# Patient Record
Sex: Female | Born: 2000 | Race: Black or African American | Hispanic: No | Marital: Married | Smoking: Current every day smoker
Health system: Southern US, Community
[De-identification: ages and names within clinical notes are randomized; demographics above are authoritative.]

## PROBLEM LIST (undated history)

## (undated) DIAGNOSIS — E119 Type 2 diabetes mellitus without complications: Secondary | ICD-10-CM

## (undated) DIAGNOSIS — F419 Anxiety disorder, unspecified: Secondary | ICD-10-CM

## (undated) DIAGNOSIS — M549 Dorsalgia, unspecified: Secondary | ICD-10-CM

## (undated) DIAGNOSIS — H4710 Unspecified papilledema: Secondary | ICD-10-CM

## (undated) DIAGNOSIS — F32A Depression, unspecified: Secondary | ICD-10-CM

## (undated) DIAGNOSIS — E282 Polycystic ovarian syndrome: Secondary | ICD-10-CM

## (undated) HISTORY — DX: Polycystic ovarian syndrome: E28.2

## (undated) HISTORY — DX: Type 2 diabetes mellitus without complications: E11.9

## (undated) HISTORY — DX: Anxiety disorder, unspecified: F41.9

## (undated) HISTORY — DX: Depression, unspecified: F32.A

## (undated) HISTORY — PX: WISDOM TOOTH EXTRACTION: SHX21

## (undated) HISTORY — DX: Unspecified papilledema: H47.10

---
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
## 2019-11-08 ENCOUNTER — Encounter: Payer: Medicaid Other | Admitting: Adult Health

## 2020-04-03 ENCOUNTER — Other Ambulatory Visit: Payer: Self-pay

## 2020-04-03 ENCOUNTER — Ambulatory Visit
Admission: EM | Admit: 2020-04-03 | Discharge: 2020-04-03 | Discharge status: Absent without leave | Payer: Commercial Managed Care - PPO

## 2020-04-03 ENCOUNTER — Emergency Department (HOSPITAL_COMMUNITY)
Admission: EM | Admit: 2020-04-03 | Discharge: 2020-04-03 | Disposition: A | Discharge status: Home / Self Care | Payer: Commercial Managed Care - PPO

## 2020-04-22 ENCOUNTER — Ambulatory Visit (INDEPENDENT_AMBULATORY_CARE_PROVIDER_SITE_OTHER): Payer: Commercial Managed Care - PPO | Admitting: Nurse Practitioner

## 2020-04-22 ENCOUNTER — Encounter: Payer: Self-pay | Admitting: Nurse Practitioner

## 2020-04-22 ENCOUNTER — Other Ambulatory Visit: Payer: Self-pay

## 2020-04-22 DIAGNOSIS — Z139 Encounter for screening, unspecified: Secondary | ICD-10-CM | POA: Diagnosis not present

## 2020-04-22 DIAGNOSIS — Z7689 Persons encountering health services in other specified circumstances: Secondary | ICD-10-CM | POA: Diagnosis not present

## 2020-04-22 DIAGNOSIS — F419 Anxiety disorder, unspecified: Secondary | ICD-10-CM | POA: Insufficient documentation

## 2020-04-22 DIAGNOSIS — F32A Depression, unspecified: Secondary | ICD-10-CM

## 2020-04-22 DIAGNOSIS — IMO0002 Reserved for concepts with insufficient information to code with codable children: Secondary | ICD-10-CM | POA: Insufficient documentation

## 2020-04-22 DIAGNOSIS — E1165 Type 2 diabetes mellitus with hyperglycemia: Secondary | ICD-10-CM | POA: Diagnosis not present

## 2020-04-22 MED ORDER — BLOOD GLUCOSE METER KIT: PACK | 0 refills | Status: DC

## 2020-04-22 MED ORDER — SERTRALINE HCL 50 MG PO TABS: 50.0000 mg | ORAL_TABLET | Freq: Every day | ORAL | 3 refills | Status: DC

## 2020-04-22 MED ORDER — METFORMIN HCL 500 MG PO TABS: 500.0000 mg | ORAL_TABLET | Freq: Two times a day (BID) | ORAL | 3 refills | Status: DC

## 2020-04-22 NOTE — Assessment & Plan Note (Addendum)
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 Sertraline -she states she is not pregnant and does not plan on becoming pregnant -med check in 1 month -GAD-7 = 19 and PHQ-9 = 17 today

## 2020-04-22 NOTE — Assessment & Plan Note (Signed)
-  request records from her GYN

## 2020-04-22 NOTE — Assessment & Plan Note (Signed)
-  A1c was 8.4% last week with GYN -Rx. Meter, lancets, and strips -Rx. metformin

## 2020-04-22 NOTE — Assessment & Plan Note (Signed)
-  BMI 55.27 -We discussed weight loss and insulin resistance -dietary handout provided

## 2020-04-22 NOTE — Assessment & Plan Note (Signed)
-  she states she had HIV testing previously -will screen for HCV with next set of labs

## 2020-04-22 NOTE — Patient Instructions (Signed)
Hyperglycemia Hyperglycemia occurs when the level of sugar (glucose) in the blood is too high. Glucose is a type of sugar that provides the body's main source of energy. Certain hormones (insulin and glucagon) control the level of glucose in the blood. Insulin lowers blood glucose, and glucagon increases blood glucose. Hyperglycemia can result from not having enough insulin in the bloodstream, or from the body not responding normally to insulin. Hyperglycemia occurs most often in people who have diabetes (diabetes mellitus), but it can happen in people who do not have diabetes. It can develop quickly, and it can be life-threatening if it causes you to become severely dehydrated (diabetic ketoacidosis or hyperglycemic hyperosmolar state). Severe hyperglycemia is a medical emergency. For most people with diabetes, a blood glucose level above 240 mg/dL is considered hyperglycemia. What are the causes? If you have diabetes, hyperglycemia may be caused by:  Medicines that increase blood glucose or affect your diabetes control.  Getting less physical activity.  Eating more than planned.  Being sick or injured, having an infection, or having surgery.  Stress.  Not giving yourself enough insulin (if you are taking insulin). If you have undiagnosed diabetes, this may be the reason you have hyperglycemia. If you do not have diabetes, hyperglycemia may be caused by:  Certain medicines, including: ? Steroid medicines. ? Beta-blockers. ? Epinephrine. ? Thiazide diuretics.  Stress.  Having a serious illness, an infection, or surgery.  Diseases of the pancreas. What increases the risk? Hyperglycemia is more likely to develop in people who have risk factors for diabetes, such as:  Having a family member with diabetes.  Certain conditions in which the body's disease-fighting system (immune system) attacks itself (autoimmune disorders).  Being overweight or obese.  Having an inactive  (sedentary) lifestyle.  Having been diagnosed with insulin resistance.  Having a history of prediabetes, gestational diabetes, or polycystic ovarian syndrome (PCOS). What are the signs or symptoms? Hyperglycemia may not cause any symptoms. If you do have symptoms, they may include:  Increased thirst.  Needing to urinate more often than usual.  Hunger.  Feeling very tired.  Blurry vision. Other symptoms may develop if hyperglycemia gets worse, such as:  Dry mouth.  Abdominal pain.  Loss of appetite.  Fruity-smelling breath.  Weakness.  Unexpected weight loss.  Tingling or numbness in the hands or feet.  Headache.  Cuts or bruises that are slow to heal. How is this diagnosed? Hyperglycemia is diagnosed with a blood test to measure your blood glucose level. This blood test is usually done while you are having symptoms. Your health care provider may also do a physical exam and review your medical history. You may have more tests to determine the cause of your hyperglycemia, such as:  A fasting blood glucose (FBG) test. You will not be allowed to eat (you will fast) for at least 8 hours before a blood sample is taken.  An A1C blood test. This provides information about blood glucose control over the previous 2-3 months.  An oral glucose tolerance test (OGTT). This measures your blood glucose at two times: ? After fasting. This is your baseline blood glucose level. ? 2 hours after drinking a beverage that contains glucose. How is this treated? Treatment depends on the cause of your hyperglycemia. Treatment may include:  Taking medicine to regulate your blood glucose levels. If you take insulin or other diabetes medicines, your medicine or dosage may be adjusted.  Lifestyle changes, such as exercising more, eating healthier foods, or losing  weight.  Treating an illness or infection.  Checking your blood glucose more often.  Stopping or reducing steroid  medicines. If your hyperglycemia becomes severe and it results in diabetic ketoacidosis or hyperglycemic hyperosmolar state, you must be hospitalized and given IV fluids and IV insulin. Follow these instructions at home: General instructions  Take over-the-counter and prescription medicines only as told by your health care provider.  Do not use any products that contain nicotine or tobacco. These products include cigarettes, chewing tobacco, and vaping devices, such as e-cigarettes. If you need help quitting, ask your health care provider.  If you drink alcohol: ? Limit how much you have to:  0-1 drink a day for women who are not pregnant.  0-2 drinks a day for men. ? Know how much alcohol is in a drink. In the [place] one drink equals one 12 oz bottle of beer (355 mL), one 5 oz glass of wine (148 mL), or one 1 oz glass of hard liquor (44 mL).  Learn to manage stress. If you need help with this, ask your health care provider.  Do exercises as told by your health care provider.  Keep all follow-up visits. This is important. Eating and drinking  Maintain a healthy weight.  Stay hydrated, especially when you exercise, get sick, or spend time in hot temperatures.  Drink enough fluid to keep your urine pale yellow.   If you have diabetes:  Know the symptoms of hyperglycemia.  Follow your diabetes management plan as told by your health care provider. Make sure you: ? Take your insulin and medicines as told. ? Follow your exercise plan. ? Follow your meal plan. Eat on time, and do not skip meals. ? Check your blood glucose as often as told. Make sure to check your blood glucose before and after exercise. If you exercise longer or in a different way, check your blood glucose more often. ? Follow your sick day plan whenever you cannot eat or drink normally. Make this plan in advance with your health care provider.  Share your diabetes management plan with people in your workplace,  school, and household.  Check your urine for ketones when you are ill and as told by your health care provider.  Carry a medical alert card or wear medical alert jewelry.   Where to find more information American Diabetes Association: www.diabetes.org Contact a health care provider if:  Your blood glucose is at or above 240 mg/dL (13.3 mmol/L) for 2 days in a row.  You have problems keeping your blood glucose in your target range.  You have frequent episodes of hyperglycemia.  You have signs of illness, such as nausea, vomiting, or fever. Get help right away if:  Your blood glucose monitor reads "high" even when you are taking insulin.  You have trouble breathing.  You have a change in how you think, feel, or act (mental status).  You have nausea or vomiting that does not go away. These symptoms may represent a serious problem that is an emergency. Do not wait to see if the symptoms will go away. Get medical help right away. Call your local emergency services (911 in the [place] Do not drive yourself to the hospital. Summary  Hyperglycemia occurs when the level of sugar (glucose) in the blood is too high.  Hyperglycemia can happen with or without diabetes, and severe hyperglycemia can be life-threatening.  Hyperglycemia is diagnosed with a blood test to measure your blood glucose level. This blood test  is usually done while you are having symptoms. Your health care provider may also do a physical exam and review your medical history.  If you have diabetes, follow your diabetes management plan as told by your health care provider.  Contact your health care provider if you have problems keeping your blood glucose in your target range. This information is not intended to replace advice given to you by your health care provider. Make sure you discuss any questions you have with your health care provider. Document Revised: 12/29/2019 Document Reviewed: 12/29/2019 Elsevier Patient  Education  2021 Elsevier Inc.  Diabetes Mellitus and Nutrition, Adult When you have diabetes, or diabetes mellitus, it is very important to have healthy eating habits because your blood sugar (glucose) levels are greatly affected by what you eat and drink. Eating healthy foods in the right amounts, at about the same times every day, can help you:  Control your blood glucose.  Lower your risk of heart disease.  Improve your blood pressure.  Reach or maintain a healthy weight. What can affect my meal plan? Every person with diabetes is different, and each person has different needs for a meal plan. Your health care provider may recommend that you work with a dietitian to make a meal plan that is best for you. Your meal plan may vary depending on factors such as:  The calories you need.  The medicines you take.  Your weight.  Your blood glucose, blood pressure, and cholesterol levels.  Your activity level.  Other health conditions you have, such as heart or kidney disease. How do carbohydrates affect me? Carbohydrates, also called carbs, affect your blood glucose level more than any other type of food. Eating carbs naturally raises the amount of glucose in your blood. Carb counting is a method for keeping track of how many carbs you eat. Counting carbs is important to keep your blood glucose at a healthy level, especially if you use insulin or take certain oral diabetes medicines. It is important to know how many carbs you can safely have in each meal. This is different for every person. Your dietitian can help you calculate how many carbs you should have at each meal and for each snack. How does alcohol affect me? Alcohol can cause a sudden decrease in blood glucose (hypoglycemia), especially if you use insulin or take certain oral diabetes medicines. Hypoglycemia can be a life-threatening condition. Symptoms of hypoglycemia, such as sleepiness, dizziness, and confusion, are similar to  symptoms of having too much alcohol.  Do not drink alcohol if: ? Your health care provider tells you not to drink. ? You are pregnant, may be pregnant, or are planning to become pregnant.  If you drink alcohol: ? Do not drink on an empty stomach. ? Limit how much you use to:  0-1 drink a day for women.  0-2 drinks a day for men. ? Be aware of how much alcohol is in your drink. In the [place] one drink equals one 12 oz bottle of beer (355 mL), one 5 oz glass of wine (148 mL), or one 1 oz glass of hard liquor (44 mL). ? Keep yourself hydrated with water, diet soda, or unsweetened iced tea.  Keep in mind that regular soda, juice, and other mixers may contain a lot of sugar and must be counted as carbs. What are tips for following this plan? Reading food labels  Start by checking the serving size on the "Nutrition Facts" label of packaged foods and drinks.  The amount of calories, carbs, fats, and other nutrients listed on the label is based on one serving of the item. Many items contain more than one serving per package.  Check the total grams (g) of carbs in one serving. You can calculate the number of servings of carbs in one serving by dividing the total carbs by 15. For example, if a food has 30 g of total carbs per serving, it would be equal to 2 servings of carbs.  Check the number of grams (g) of saturated fats and trans fats in one serving. Choose foods that have a low amount or none of these fats.  Check the number of milligrams (mg) of salt (sodium) in one serving. Most people should limit total sodium intake to less than 2,300 mg per day.  Always check the nutrition information of foods labeled as "low-fat" or "nonfat." These foods may be higher in added sugar or refined carbs and should be avoided.  Talk to your dietitian to identify your daily goals for nutrients listed on the label. Shopping  Avoid buying canned, pre-made, or processed foods. These foods tend to be high in  fat, sodium, and added sugar.  Shop around the outside edge of the grocery store. This is where you will most often find fresh fruits and vegetables, bulk grains, fresh meats, and fresh dairy. Cooking  Use low-heat cooking methods, such as baking, instead of high-heat cooking methods like deep frying.  Cook using healthy oils, such as olive, canola, or sunflower oil.  Avoid cooking with butter, cream, or high-fat meats. Meal planning  Eat meals and snacks regularly, preferably at the same times every day. Avoid going long periods of time without eating.  Eat foods that are high in fiber, such as fresh fruits, vegetables, beans, and whole grains. Talk with your dietitian about how many servings of carbs you can eat at each meal.  Eat 4-6 oz (112-168 g) of lean protein each day, such as lean meat, chicken, fish, eggs, or tofu. One ounce (oz) of lean protein is equal to: ? 1 oz (28 g) of meat, chicken, or fish. ? 1 egg. ?  cup (62 g) of tofu.  Eat some foods each day that contain healthy fats, such as avocado, nuts, seeds, and fish.   What foods should I eat? Fruits Berries. Apples. Oranges. Peaches. Apricots. Plums. Grapes. Mango. Papaya. Pomegranate. Kiwi. Cherries. Vegetables Lettuce. Spinach. Leafy greens, including kale, chard, collard greens, and mustard greens. Beets. Cauliflower. Cabbage. Broccoli. Carrots. Green beans. Tomatoes. Peppers. Onions. Cucumbers. Brussels sprouts. Grains Whole grains, such as whole-wheat or whole-grain bread, crackers, tortillas, cereal, and pasta. Unsweetened oatmeal. Quinoa. Brown or wild rice. Meats and other proteins Seafood. Poultry without skin. Lean cuts of poultry and beef. Tofu. Nuts. Seeds. Dairy Low-fat or fat-free dairy products such as milk, yogurt, and cheese. The items listed above may not be a complete list of foods and beverages you can eat. Contact a dietitian for more information. What foods should I avoid? Fruits Fruits canned  with syrup. Vegetables Canned vegetables. Frozen vegetables with butter or cream sauce. Grains Refined white flour and flour products such as bread, pasta, snack foods, and cereals. Avoid all processed foods. Meats and other proteins Fatty cuts of meat. Poultry with skin. Breaded or fried meats. Processed meat. Avoid saturated fats. Dairy Full-fat yogurt, cheese, or milk. Beverages Sweetened drinks, such as soda or iced tea. The items listed above may not be a complete list of foods and beverages  you should avoid. Contact a dietitian for more information. Questions to ask a health care provider  Do I need to meet with a diabetes educator?  Do I need to meet with a dietitian?  What number can I call if I have questions?  When are the best times to check my blood glucose? Where to find more information:  American Diabetes Association: diabetes.org  Academy of Nutrition and Dietetics: www.eatright.[employer] of Diabetes and Digestive and Kidney Diseases: CarFlippers.tn  Association of Diabetes Care and Education Specialists: www.diabeteseducator.org Summary  It is important to have healthy eating habits because your blood sugar (glucose) levels are greatly affected by what you eat and drink.  A healthy meal plan will help you control your blood glucose and maintain a healthy lifestyle.  Your health care provider may recommend that you work with a dietitian to make a meal plan that is best for you.  Keep in mind that carbohydrates (carbs) and alcohol have immediate effects on your blood glucose levels. It is important to count carbs and to use alcohol carefully. This information is not intended to replace advice given to you by your health care provider. Make sure you discuss any questions you have with your health care provider. Document Revised: 02/21/2019 Document Reviewed: 02/21/2019 Elsevier Patient Education  2021 [employer].

## 2020-04-22 NOTE — Progress Notes (Signed)
New Patient Office Visit  Subjective:  Patient ID: Savannah Parker, female    DOB: June 09, 2000  Age: 20 y.o. MRN: [id number]  CC:  Chief Complaint  Patient presents with  . New Patient (Initial Visit)    HPI Savannah Parker presents for new patient visit. No recent PCP.  Last saw GYN last week. Dr. Gerrit Friends is her GYN. Last labs were drawn last week. Her A1c was 8.4 last week. Last physical was over a year ago.  Her mother died, and she is taking care of her 3 siblings.  She is also married and she is having difficulty with all of the caregiver strain from her siblings. She states she is anxious all the time and has some depression as well.  She has been taking melatonin and "sleep-aid" to fall asleep. Gad-7 =19, PHQ-9 = 17 today.  She has an appointment with a therapist in a week, with [facility] in [place].She has been taking care of her siblings since she was age 19, but her mother passed last year and she is still feeling emotional about this.  Past Medical History:  Diagnosis Date  . Anxiety    Phreesia 04/19/2020  . Back pain   . Depression   . Diabetes mellitus without complication [facility])     Past Surgical History:  Procedure Laterality Date  . WISDOM TOOTH EXTRACTION Bilateral    2 removed    History reviewed. No pertinent family history.  Social History   Socioeconomic History  . Marital status: Married    Spouse name: Not on file  . Number of children: Not on file  . Years of education: Not on file  . Highest education level: Not on file  Occupational History    Comment: scheduling with [employer]  Tobacco Use  . Smoking status: Current Every Day Smoker    Packs/day: 0.25    Types: Cigarettes  . Smokeless tobacco: Never Used  Vaping Use  . Vaping Use: Never used  Substance and Sexual Activity  . Alcohol use: No    Comment: rarely  . Drug use: No  . Sexual activity: Yes    Birth control/protection: None  Other Topics Concern  . Not on file  Social History Narrative   . Not on file   Social Determinants of Health   Financial Resource Strain: Not on file  Food Insecurity: Not on file  Transportation Needs: Not on file  Physical Activity: Not on file  Stress: Not on file  Social Connections: Not on file  Intimate Partner Violence: Not on file    ROS Review of Systems  Constitutional: Negative.   Respiratory: Negative.   Cardiovascular: Negative.   Genitourinary: Positive for pelvic pain.       Followed by GYN    Objective:   Today's Vitals: BP (!) 142/80   Pulse (!) 116   Temp 98.6 F (37 C)   Resp 20   Ht 5' 3"  (1.6 m)   Wt (!) 312 lb (141.5 kg)   SpO2 99%   BMI 55.27 kg/m   Physical Exam Constitutional:      Appearance: She is obese.  Cardiovascular:     Rate and Rhythm: Normal rate and regular rhythm.     Pulses: Normal pulses.     Heart sounds: Normal heart sounds.  Pulmonary:     Effort: Pulmonary effort is normal.     Breath sounds: Normal breath sounds.  Neurological:     Mental Status: She is alert.  Assessment & Plan:   Problem List Items Addressed This Visit      Endocrine   Type II diabetes mellitus, uncontrolled ([place])    -A1c was 8.4% last week with GYN -Rx. Meter, lancets, and strips -Rx. metformin      Relevant Medications   metFORMIN (GLUCOPHAGE) 500 MG tablet   Other Relevant Orders   CBC with Differential/Platelet   CMP14+EGFR   Hemoglobin A1c   Lipid Panel With LDL/HDL Ratio   Urine Microalbumin w/creat. ratio     Other   Encounter to establish care    -request records from her GYN       Screening due    -she states she had HIV testing previously -will screen for HCV with next set of labs      Relevant Orders   HCV Ab w/Rflx to Verification   Morbid obesity ([place])    -BMI 55.27 -We discussed weight loss and insulin resistance -dietary handout provided      Relevant Medications   metFORMIN (GLUCOPHAGE) 500 MG tablet   Anxiety and depression    -her mother recently died  and she is processing that; still gets emotional -she is caregiver to 3 siblings and has a husband -Rx. Sertraline -she states she is not pregnant and does not plan on becoming pregnant -med check in 1 month -GAD-7 = 19 and PHQ-9 = 17 today      Relevant Medications   hydrOXYzine (ATARAX/VISTARIL) 25 MG tablet   sertraline (ZOLOFT) 50 MG tablet      Outpatient Encounter Medications as of 04/22/2020  Medication Sig  . blood glucose meter kit and supplies Dispense based on patient and insurance preference. Use daily to monitor blood sugar. (E11.65)  . hydrOXYzine (ATARAX/VISTARIL) 25 MG tablet Take 25 mg by mouth 3 (three) times daily as needed.  . metFORMIN (GLUCOPHAGE) 500 MG tablet Take 1 tablet (500 mg total) by mouth 2 (two) times daily with a meal.  . sertraline (ZOLOFT) 50 MG tablet Take 1 tablet (50 mg total) by mouth daily.  . traMADol (ULTRAM) 50 MG tablet Take 1 every 6-8 hours for pain not helped by Tylenol or Motrin   No facility-administered encounter medications on file as of 04/22/2020.    Follow-up: Return in about 1 month (around 05/23/2020) for Medication check.   Noreene Larsson, NP

## 2020-05-14 ENCOUNTER — Other Ambulatory Visit: Payer: Self-pay | Admitting: Nurse Practitioner

## 2020-05-17 ENCOUNTER — Other Ambulatory Visit: Payer: Self-pay

## 2020-05-17 ENCOUNTER — Telehealth: Payer: Self-pay

## 2020-05-17 DIAGNOSIS — E1165 Type 2 diabetes mellitus with hyperglycemia: Secondary | ICD-10-CM

## 2020-05-17 MED ORDER — BLOOD GLUCOSE METER KIT: PACK | 0 refills | Status: DC

## 2020-05-17 NOTE — Telephone Encounter (Signed)
Patient needing refills [facility] [place] for her test strips p# [phone]

## 2020-05-17 NOTE — Telephone Encounter (Signed)
Orders faxed to [facility].

## 2020-05-20 ENCOUNTER — Other Ambulatory Visit: Payer: Self-pay

## 2020-05-20 ENCOUNTER — Other Ambulatory Visit: Payer: Self-pay | Admitting: Nurse Practitioner

## 2020-05-20 DIAGNOSIS — E1165 Type 2 diabetes mellitus with hyperglycemia: Secondary | ICD-10-CM

## 2020-05-20 MED ORDER — GLUCOSE BLOOD VI STRP: ORAL_STRIP | 12 refills | Status: DC

## 2020-05-20 MED ORDER — ACCU-CHEK SOFTCLIX LANCETS MISC: 12 refills | Status: AC

## 2020-05-20 MED ORDER — UNABLE TO FIND: 0 refills | Status: DC

## 2020-05-21 ENCOUNTER — Other Ambulatory Visit: Payer: Self-pay | Admitting: Nurse Practitioner

## 2020-05-21 DIAGNOSIS — E1165 Type 2 diabetes mellitus with hyperglycemia: Secondary | ICD-10-CM

## 2020-05-21 MED ORDER — BLOOD GLUCOSE METER KIT: PACK | 0 refills | Status: AC

## 2020-05-21 NOTE — Telephone Encounter (Signed)
Sent order for new meter since they don't have accucheck strips

## 2020-05-23 ENCOUNTER — Ambulatory Visit: Payer: Commercial Managed Care - PPO | Admitting: Nurse Practitioner

## 2020-06-19 ENCOUNTER — Encounter (HOSPITAL_COMMUNITY): Payer: Self-pay

## 2020-06-19 ENCOUNTER — Emergency Department (HOSPITAL_COMMUNITY): Payer: Medicaid Other

## 2020-06-19 ENCOUNTER — Emergency Department (HOSPITAL_COMMUNITY)
Admission: EM | Admit: 2020-06-19 | Discharge: 2020-06-19 | Disposition: A | Discharge status: Home / Self Care | Payer: Medicaid Other | Attending: Emergency Medicine | Admitting: Emergency Medicine

## 2020-06-19 ENCOUNTER — Other Ambulatory Visit: Payer: Self-pay

## 2020-06-19 DIAGNOSIS — F17210 Nicotine dependence, cigarettes, uncomplicated: Secondary | ICD-10-CM | POA: Diagnosis not present

## 2020-06-19 DIAGNOSIS — R000 Tachycardia, unspecified: Secondary | ICD-10-CM | POA: Diagnosis not present

## 2020-06-19 DIAGNOSIS — E119 Type 2 diabetes mellitus without complications: Secondary | ICD-10-CM | POA: Diagnosis not present

## 2020-06-19 DIAGNOSIS — R112 Nausea with vomiting, unspecified: Secondary | ICD-10-CM

## 2020-06-19 DIAGNOSIS — R509 Fever, unspecified: Secondary | ICD-10-CM | POA: Diagnosis present

## 2020-06-19 DIAGNOSIS — R0602 Shortness of breath: Secondary | ICD-10-CM

## 2020-06-19 DIAGNOSIS — Z7984 Long term (current) use of oral hypoglycemic drugs: Secondary | ICD-10-CM | POA: Diagnosis not present

## 2020-06-19 DIAGNOSIS — U071 COVID-19: Secondary | ICD-10-CM

## 2020-06-19 DIAGNOSIS — R197 Diarrhea, unspecified: Secondary | ICD-10-CM

## 2020-06-19 LAB — CBC WITH DIFFERENTIAL/PLATELET
Abs Immature Granulocytes: 0.04 10*3/uL (ref 0.00–0.07)
Basophils Absolute: 0 10*3/uL (ref 0.0–0.1)
Basophils Relative: 0 %
Eosinophils Absolute: 0.1 10*3/uL (ref 0.0–0.5)
Eosinophils Relative: 1 %
HCT: 37.3 % (ref 36.0–46.0)
Hemoglobin: 12.4 g/dL (ref 12.0–15.0)
Immature Granulocytes: 0 %
Lymphocytes Relative: 14 %
Lymphs Abs: 1.6 10*3/uL (ref 0.7–4.0)
MCH: 29.5 pg (ref 26.0–34.0)
MCHC: 33.2 g/dL (ref 30.0–36.0)
MCV: 88.6 fL (ref 80.0–100.0)
Monocytes Absolute: 1.1 10*3/uL — ABNORMAL HIGH (ref 0.1–1.0)
Monocytes Relative: 10 %
Neutro Abs: 8.2 10*3/uL — ABNORMAL HIGH (ref 1.7–7.7)
Neutrophils Relative %: 75 %
Platelets: 404 10*3/uL — ABNORMAL HIGH (ref 150–400)
RBC: 4.21 MIL/uL (ref 3.87–5.11)
RDW: 13.2 % (ref 11.5–15.5)
WBC: 11 10*3/uL — ABNORMAL HIGH (ref 4.0–10.5)
nRBC: 0 % (ref 0.0–0.2)

## 2020-06-19 LAB — COMPREHENSIVE METABOLIC PANEL
ALT: 22 U/L (ref 0–44)
AST: 21 U/L (ref 15–41)
Albumin: 3.6 g/dL (ref 3.5–5.0)
Alkaline Phosphatase: 56 U/L (ref 38–126)
Anion gap: 12 (ref 5–15)
BUN: 6 mg/dL (ref 6–20)
CO2: 22 mmol/L (ref 22–32)
Calcium: 8.5 mg/dL — ABNORMAL LOW (ref 8.9–10.3)
Chloride: 100 mmol/L (ref 98–111)
Creatinine, Ser: 0.68 mg/dL (ref 0.44–1.00)
GFR, Estimated: >60 mL/min (ref >60)
Glucose, Bld: 93 mg/dL (ref 70–99)
Potassium: 3.4 mmol/L — ABNORMAL LOW (ref 3.5–5.1)
Sodium: 134 mmol/L — ABNORMAL LOW (ref 135–145)
Total Bilirubin: 0.7 mg/dL (ref 0.3–1.2)
Total Protein: 8.5 g/dL — ABNORMAL HIGH (ref 6.5–8.1)

## 2020-06-19 LAB — URINALYSIS, ROUTINE W REFLEX MICROSCOPIC
Bilirubin Urine: NEGATIVE
Glucose, UA: NEGATIVE mg/dL
Hgb urine dipstick: NEGATIVE
Ketones, ur: 20 mg/dL — AB
Nitrite: NEGATIVE
Protein, ur: NEGATIVE mg/dL
Specific Gravity, Urine: 1.019 (ref 1.005–1.030)
pH: 7 (ref 5.0–8.0)

## 2020-06-19 LAB — LIPASE, BLOOD: Lipase: 19 U/L (ref 11–51)

## 2020-06-19 LAB — RESP PANEL BY RT-PCR (FLU A&B, COVID) ARPGX2
Influenza A by PCR: NEGATIVE
Influenza B by PCR: NEGATIVE
SARS Coronavirus 2 by RT PCR: POSITIVE — AB

## 2020-06-19 LAB — POC SARS CORONAVIRUS 2 AG -  ED: SARS Coronavirus 2 Ag: NEGATIVE

## 2020-06-19 LAB — I-STAT BETA HCG BLOOD, ED (MC, WL, AP ONLY): I-stat hCG, quantitative: <5 m[IU]/mL (ref <5)

## 2020-06-19 LAB — TROPONIN I (HIGH SENSITIVITY)
Troponin I (High Sensitivity): 3 ng/L (ref <18)
Troponin I (High Sensitivity): <2 ng/L (ref <18)

## 2020-06-19 MED ORDER — ACETAMINOPHEN 325 MG PO TABS
650.0000 mg | ORAL_TABLET | Freq: Once | ORAL | Status: AC
  Administered 2020-06-19: 650 mg via ORAL
  Filled 2020-06-19: qty 2

## 2020-06-19 MED ORDER — IOHEXOL 350 MG/ML SOLN
100.0000 mL | Freq: Once | INTRAVENOUS | Status: AC | PRN
  Administered 2020-06-19: 100 mL via INTRAVENOUS

## 2020-06-19 MED ORDER — ALBUTEROL SULFATE HFA 108 (90 BASE) MCG/ACT IN AERS
2.0000 | INHALATION_SPRAY | RESPIRATORY_TRACT | Status: DC | PRN
  Administered 2020-06-19: 2 via RESPIRATORY_TRACT
  Filled 2020-06-19: qty 6.7

## 2020-06-19 MED ORDER — ONDANSETRON 4 MG PO TBDP: 4.0000 mg | ORAL_TABLET | Freq: Three times a day (TID) | ORAL | 0 refills | Status: DC | PRN

## 2020-06-19 MED ORDER — MORPHINE SULFATE (PF) 4 MG/ML IV SOLN
4.0000 mg | Freq: Once | INTRAVENOUS | Status: AC
Start: 2020-06-19 — End: 2020-06-19
  Administered 2020-06-19: 4 mg via INTRAVENOUS
  Filled 2020-06-19: qty 1

## 2020-06-19 MED ORDER — SODIUM CHLORIDE 0.9 % IV BOLUS
1000.0000 mL | Freq: Once | INTRAVENOUS | Status: AC
  Administered 2020-06-19: 1000 mL via INTRAVENOUS

## 2020-06-19 MED ORDER — ONDANSETRON HCL 4 MG/2ML IJ SOLN
4.0000 mg | Freq: Once | INTRAMUSCULAR | Status: AC
  Administered 2020-06-19: 4 mg via INTRAVENOUS
  Filled 2020-06-19: qty 2

## 2020-06-19 MED ORDER — DICYCLOMINE HCL 20 MG PO TABS: 20.0000 mg | ORAL_TABLET | Freq: Two times a day (BID) | ORAL | 0 refills | Status: DC

## 2020-06-19 NOTE — ED Notes (Addendum)
Pt able to tolerate PO fluids at this time.  

## 2020-06-19 NOTE — Discharge Instructions (Signed)
Your Covid test was positive here today.  This is likely the cause of your symptoms.  I recommend Tylenol and ibuprofen as needed for pain and fever.  I have written you a medication called Bentyl to help with abdominal pain.  I have also written you a medication called Zofran.  Take as needed for nausea and vomiting.  Follow up with primary care provider in the next few days for reevaluation  You will need to be out of work or school for 1 week.  I have written you a work note.  Return to emergency department if you have any new or worsening complaints

## 2020-06-19 NOTE — ED Triage Notes (Signed)
Pt c/o chest pressure, sob, n/v/d, body aches and fevers and rt flank pain x 5 days. Denies hx of kidney stones and denies exposure to covid. Has not taken antipyretic today, temp 101.

## 2020-06-19 NOTE — ED Provider Notes (Signed)
[facility] DEPT Provider Note   CSN: [id number] Arrival date & time: 06/19/20  1638     History Chief Complaint  Patient presents with  . Shortness of Breath    Savannah Parker is a 20 y.o. female with history significant for diabetes who presents for evaluation of feeling unwell.  States symptoms started on Sunday, 4 days PTA.  She is unvaccinated for Covid.  Has been running a fever.  Has been taking TheraFlu.  Has not taken anything for fever today.  She feels like she has had persistent shortness of breath and chest pain.  Pain located to right chest, left flank and back.  Also has diffuse right-sided abdominal pain.  Has been constant since Sunday.  Pain is not worse with food intake or she has had multiple episodes of NBNB emesis.  She is also having diffuse diarrhea that melena or bright red blood per rectum.  Her significant other is not sick.  She has had no sick contacts.  She denies any pelvic pain, vaginal discharge or concerns for STDs.  She denies any cough, hemoptysis.  No dysuria, hematuria.  No unilateral leg swelling, redness or warmth.  No prior history of PE, DVT, recent surgery, immobilization or malignancy.  Denies additional aggravating or alleviating factors.  No recent travel, antibiotics.  History obtained from patient and past medical records.  No interpreter used.  HPI     Past Medical History:  Diagnosis Date  . Anxiety    Phreesia 04/19/2020  . Back pain   . Depression   . Diabetes mellitus without complication [facility])     Patient Active Problem List   Diagnosis Date Noted  . Encounter to establish care 04/22/2020  . Screening due 04/22/2020  . Type II diabetes mellitus, uncontrolled ([place]) 04/22/2020  . Morbid obesity ([place]) 04/22/2020  . Anxiety and depression 04/22/2020    Past Surgical History:  Procedure Laterality Date  . WISDOM TOOTH EXTRACTION Bilateral    2 removed     OB History   No obstetric history on  file.     No family history on file.  Social History   Tobacco Use  . Smoking status: Current Every Day Smoker    Packs/day: 0.25    Types: Cigarettes  . Smokeless tobacco: Never Used  Vaping Use  . Vaping Use: Never used  Substance Use Topics  . Alcohol use: No    Comment: rarely  . Drug use: No    Home Medications Prior to Admission medications   Medication Sig Start Date End Date Taking? Authorizing Provider  acetaminophen (TYLENOL) 500 MG tablet Take 1,000 mg by mouth every 6 (six) hours as needed for moderate pain.   Yes [provider]  dicyclomine (BENTYL) 20 MG tablet Take 1 tablet (20 mg total) by mouth 2 (two) times daily. 06/19/20  Yes Morgann Woodburn A, PA-C  metFORMIN (GLUCOPHAGE) 500 MG tablet Take 1 tablet (500 mg total) by mouth 2 (two) times daily with a meal. 04/22/20  Yes Noreene Larsson, NP  ondansetron (ZOFRAN ODT) 4 MG disintegrating tablet Take 1 tablet (4 mg total) by mouth every 8 (eight) hours as needed for nausea or vomiting. 06/19/20  Yes Wesly Whisenant A, PA-C  sertraline (ZOLOFT) 50 MG tablet TAKE 1 TABLET BY MOUTH EVERY DAY Patient taking differently: Take 50 mg by mouth daily. 05/14/20  Yes Noreene Larsson, NP  ACCU-CHEK GUIDE test strip ACCU-CHECK GUIDE TEST STRIPS. USE DAILY AS INSTRUCTED 05/21/20  Noreene Larsson, NP  Accu-Chek Softclix Lancets lancets Use as instructed 05/20/20   Noreene Larsson, NP  blood glucose meter kit and supplies Dispense based on patient and insurance preference. Use daily to monitor blood sugar. (E11.65) 05/21/20   Noreene Larsson, NP  traMADol Veatrice Bourbon) 50 MG tablet Take 1 every 6-8 hours for pain not helped by Tylenol or Motrin Patient not taking: No sig reported 07/10/17   Milton Ferguson, MD  UNABLE TO FIND Accu-check guide monitor system Patient not taking: No sig reported 05/20/20   Noreene Larsson, NP    Allergies    Onion and Other  Review of Systems   Review of Systems  Constitutional: Positive for activity  change, appetite change, fatigue and fever.  HENT: Negative.   Respiratory: Positive for chest tightness and shortness of breath. Negative for cough, choking, wheezing and stridor.   Cardiovascular: Positive for chest pain (Tightness). Negative for palpitations and leg swelling.  Gastrointestinal: Positive for abdominal pain (Diffuse right abd pain), diarrhea, nausea and vomiting. Negative for anal bleeding, blood in stool and constipation.  Genitourinary: Positive for flank pain (Right flank). Negative for difficulty urinating and dysuria.  Musculoskeletal: Positive for myalgias. Negative for arthralgias, back pain, gait problem, joint swelling, neck pain and neck stiffness.  Skin: Negative.   Neurological: Negative.   All other systems reviewed and are negative.   Physical Exam Updated Vital Signs BP (!) 155/92   Pulse 91   Temp 99.7 F (37.6 C) (Oral)   Resp 19   Ht 5' 3"  (1.6 m)   Wt (!) 141.5 kg   LMP 06/10/2020   SpO2 99%   BMI 55.27 kg/m   Physical Exam Vitals and nursing note reviewed.  Constitutional:      General: She is not in acute distress.    Appearance: She is well-developed. She is not ill-appearing, toxic-appearing or diaphoretic.  HENT:     Head: Normocephalic and atraumatic.     Mouth/Throat:     Pharynx: Oropharynx is clear.  Eyes:     Pupils: Pupils are equal, round, and reactive to light.  Cardiovascular:     Rate and Rhythm: Tachycardia present.     Pulses: Normal pulses.     Heart sounds: Normal heart sounds.  Pulmonary:     Effort: Pulmonary effort is normal. No respiratory distress.     Breath sounds: Normal breath sounds.     Comments: Clear to auscultation bilaterally.  Speaks in full sentences. Chest:     Comments: Equal rise and fall to chest wall Abdominal:     General: Bowel sounds are normal. There is no distension.     Palpations: Abdomen is soft. There is no mass.     Tenderness: There is abdominal tenderness. There is no guarding  or rebound.     Comments: Soft, without rebound or guarding.  Diffuse tenderness to right abdomen.  Diffuse tenderness to right flank.  Negative left CVA tap.  Musculoskeletal:        General: Normal range of motion.     Cervical back: Normal range of motion and neck supple.     Right lower leg: No tenderness. No edema.     Left lower leg: No tenderness. No edema.     Comments: No bony tenderness.  Moves all 4 extremities without difficulty.  Compartments soft.  Bevelyn Buckles' sign negative.  Skin:    General: Skin is warm and dry.     Capillary Refill: Capillary refill  takes less than 2 seconds.     Comments: No edema, erythema or warmth.  Neurological:     General: No focal deficit present.     Mental Status: She is alert and oriented to person, place, and time.     Comments: Cranial nerves II to XII grossly intact      ED Results / Procedures / Treatments   Labs (all labs ordered are listed, but only abnormal results are displayed) Labs Reviewed  RESP PANEL BY RT-PCR (FLU A&B, COVID) ARPGX2 - Abnormal; Notable for the following components:      Result Value   SARS Coronavirus 2 by RT PCR POSITIVE (*)    All other components within normal limits  CBC WITH DIFFERENTIAL/PLATELET - Abnormal; Notable for the following components:   WBC 11.0 (*)    Platelets 404 (*)    Neutro Abs 8.2 (*)    Monocytes Absolute 1.1 (*)    All other components within normal limits  COMPREHENSIVE METABOLIC PANEL - Abnormal; Notable for the following components:   Sodium 134 (*)    Potassium 3.4 (*)    Calcium 8.5 (*)    Total Protein 8.5 (*)    All other components within normal limits  URINALYSIS, ROUTINE W REFLEX MICROSCOPIC - Abnormal; Notable for the following components:   Ketones, ur 20 (*)    Leukocytes,Ua MODERATE (*)    Bacteria, UA MANY (*)    All other components within normal limits  URINE CULTURE  LIPASE, BLOOD  I-STAT BETA HCG BLOOD, ED ([facility], [facility], [facility] ONLY)  POC SARS CORONAVIRUS 2 AG -   ED  TROPONIN I (HIGH SENSITIVITY)  TROPONIN I (HIGH SENSITIVITY)    EKG EKG Interpretation  Date/Time:  Wednesday June 19 2020 16:49:00 EDT Ventricular Rate:  105 PR Interval:    QRS Duration: 71 QT Interval:  330 QTC Calculation: 437 R Axis:   53 Text Interpretation: Sinus tachycardia Abnormal Q suggests anterior infarct 12 Lead; [place]ar No old tracing to compare Confirmed by Dorie Rank [phone]) on 06/19/2020 6:32:39 PM   Radiology CT Angio Chest PE W/Cm &/Or Wo Cm  Result Date: 06/19/2020 CLINICAL DATA:  Chest pain, shortness of breath, tachycardia EXAM: CT ANGIOGRAPHY CHEST WITH CONTRAST TECHNIQUE: Multidetector CT imaging of the chest was performed using the standard protocol during bolus administration of intravenous contrast. Multiplanar CT image reconstructions and MIPs were obtained to evaluate the vascular anatomy. CONTRAST:  124m OMNIPAQUE IOHEXOL 350 MG/ML SOLN COMPARISON:  None. FINDINGS: Cardiovascular: No filling defects in the pulmonary arteries to suggest pulmonary emboli. Heart is normal size. Aorta is normal caliber. Mediastinum/Nodes: No mediastinal, hilar, or axillary adenopathy. Trachea and esophagus are unremarkable. Thyroid unremarkable. Soft tissue in the anterior mediastinum felt to reflect residual thymus. Lungs/Pleura: Lungs are clear. No focal airspace opacities or suspicious nodules. No effusions. Upper Abdomen: Imaging into the upper abdomen demonstrates no acute findings. Diffuse low-density throughout the liver compatible with fatty infiltration. Musculoskeletal: Chest wall soft tissues are unremarkable. No acute bony abnormality. Review of the MIP images confirms the above findings. IMPRESSION: No evidence of pulmonary embolus. No acute cardiopulmonary disease. Hepatic steatosis Electronically Signed   By: KRolm BaptiseM.D.   On: 06/19/2020 19:00   CT Abdomen Pelvis W Contrast  Result Date: 06/19/2020 CLINICAL DATA:  Abdominal pain EXAM: CT ABDOMEN AND  PELVIS WITH CONTRAST TECHNIQUE: Multidetector CT imaging of the abdomen and pelvis was performed using the standard protocol following bolus administration of intravenous contrast. CONTRAST:  1044m  OMNIPAQUE IOHEXOL 350 MG/ML SOLN COMPARISON:  None. FINDINGS: Lower chest: Lung bases are clear. No effusions. Heart is normal size. Hepatobiliary: Diffuse low-density throughout the liver compatible with fatty infiltration. No focal abnormality. Gallbladder unremarkable. Pancreas: No focal abnormality or ductal dilatation. Spleen: No focal abnormality.  Normal size. Adrenals/Urinary Tract: No adrenal abnormality. No focal renal abnormality. No stones or hydronephrosis. Urinary bladder is unremarkable. Stomach/Bowel: Normal appendix. Stomach, large and small bowel grossly unremarkable. Vascular/Lymphatic: No evidence of aneurysm or adenopathy. Reproductive: Uterus and adnexa unremarkable.  No mass. Other: No free fluid or free air. Musculoskeletal: No acute bony abnormality. IMPRESSION: Hepatic steatosis. No acute findings in the abdomen or pelvis. Normal appendix. Electronically Signed   By: Rolm Baptise M.D.   On: 06/19/2020 19:01   DG Chest Port 1 View  Result Date: 06/19/2020 CLINICAL DATA:  Chest pressure, nausea and vomiting, diarrhea, right flank pain for 5 days EXAM: PORTABLE CHEST 1 VIEW COMPARISON:  None. FINDINGS: The heart size and mediastinal contours are within normal limits. Both lungs are clear. The visualized skeletal structures are unremarkable. IMPRESSION: No active disease. Electronically Signed   By: Randa Ngo M.D.   On: 06/19/2020 18:41    Procedures Procedures   Medications Ordered in ED Medications  albuterol (VENTOLIN HFA) 108 (90 Base) MCG/ACT inhaler 2 puff (2 puffs Inhalation Given 06/19/20 1735)  sodium chloride 0.9 % bolus 1,000 mL (0 mLs Intravenous Stopped 06/19/20 1957)  ondansetron (ZOFRAN) injection 4 mg (4 mg Intravenous Given 06/19/20 1734)  morphine 4 MG/ML injection  4 mg (4 mg Intravenous Given 06/19/20 1735)  acetaminophen (TYLENOL) tablet 650 mg (650 mg Oral Given 06/19/20 1735)  iohexol (OMNIPAQUE) 350 MG/ML injection 100 mL (100 mLs Intravenous Contrast Given 06/19/20 1842)    ED Course  I have reviewed the triage vital signs and the nursing notes.  Pertinent labs & imaging results that were available during my care of the patient were reviewed by me and considered in my medical decision making (see chart for details).  20 year old presents for evaluation of feeling unwell.  On arrival she is febrile and mildly tachycardic however does not appear septic.  unvaccinated for COVID.  No sick contacts.  She admits to some chest tightness however denies cough, hemoptysis.  She feels short of breath. CP no rating to left arm, left jaw.  No associated diaphoresis.  Heart score 1 for obesity however given age, low suspicion for ACS.  Abdomen diffusely tender to right abdomen, right flank.  We will plan on labs, imaging, treatment management and reassess.  Hold on code sepsis at this time.  Labs and imaging personally reviewed and interpreted:  CBC with leukocytosis at 11.0, hemoglobin 16.1 Metabolic panel sodium 096, potassium 3.4, normal LFTs Lipase 19 Trop 3 COVID Positive Preg negative EKG tachycardia CTA chest without PE, infiltrate, pneumothorax CT abdomen pelvis without acute findings  Patient reassessed.  Tachycardia has improved with IV fluids.  She is tolerating p.o. intake.  Discussed positive Covid test.  Will DC home with symptomatic management.  She is ambulatory that any hypoxia here.  Low suspicion for secondary bacterial infection.  Imaging and labs reassuring.  The patient has been appropriately medically screened and/or stabilized in the ED. I have low suspicion for any other emergent medical condition which would require further screening, evaluation or treatment in the ED or require inpatient management.  Patient is hemodynamically stable  and in no acute distress.  Patient able to ambulate in department prior to ED.  Evaluation does not show acute pathology that would require ongoing or additional emergent interventions while in the emergency department or further inpatient treatment.  I have discussed the diagnosis with the patient and answered all questions.  Pain is been managed while in the emergency department and patient has no further complaints prior to discharge.  Patient is comfortable with plan discussed in room and is stable for discharge at this time.  I have discussed strict return precautions for returning to the emergency department.  Patient was encouraged to follow-up with PCP/specialist refer to at discharge.     MDM Rules/Calculators/A&P                         Ketzaly Cardella was evaluated in Emergency Department on 06/19/2020 for the symptoms described in the history of present illness. She was evaluated in the context of the global COVID-19 pandemic, which necessitated consideration that the patient might be at risk for infection with the SARS-CoV-2 virus that causes COVID-19. Institutional protocols and algorithms that pertain to the evaluation of patients at risk for COVID-19 are in a state of rapid change based on information released by regulatory bodies including the CDC and federal and state organizations. These policies and algorithms were followed during the patient's care in the ED. Final Clinical Impression(s) / ED Diagnoses Final diagnoses:  COVID  Nausea vomiting and diarrhea    Rx / DC Orders ED Discharge Orders         Ordered    dicyclomine (BENTYL) 20 MG tablet  2 times daily        06/19/20 2142    ondansetron (ZOFRAN ODT) 4 MG disintegrating tablet  Every 8 hours PRN        06/19/20 2142           Britley Gashi A, PA-C 06/19/20 2144    Dorie Rank, MD 06/20/20 1504

## 2020-06-19 NOTE — Progress Notes (Signed)
PT does not have respiratory history- states out grew asthma years ago. Does not take any home meds pertaining to breathing issues. PT on RA with Sp02 100%, RR 14, BBS clear upper and diminished lower, PT reports non productive cough. Will continue with MDI PRN at this time.

## 2020-06-20 ENCOUNTER — Telehealth: Payer: Self-pay

## 2020-06-20 NOTE — Telephone Encounter (Signed)
Pt. Called back and would like to speak with APP.

## 2020-06-20 NOTE — Telephone Encounter (Signed)
Called to discuss with patient about COVID-19 symptoms and the use of one of the available treatments for those with mild to moderate Covid symptoms and at a high risk of hospitalization.  Pt appears to qualify for outpatient treatment due to co-morbid conditions and/or a member of an at-risk group in accordance with the FDA Emergency Use Authorization.    Symptom onset: 06/16/20 Shortness of breath,chest pain Vaccinated: No Booster? No Immunocompromised? No Qualifiers: DM, Obesity  Unable to reach pt - Left message and call back number [phone].   Esther Hardy

## 2020-06-22 LAB — URINE CULTURE: Culture: >=100000 — AB

## 2020-06-23 ENCOUNTER — Telehealth (HOSPITAL_BASED_OUTPATIENT_CLINIC_OR_DEPARTMENT_OTHER): Payer: Self-pay | Admitting: Emergency Medicine

## 2020-06-23 NOTE — Telephone Encounter (Signed)
Post ED Visit - Positive Culture Follow-up: Successful Patient Follow-Up  Culture assessed and recommendations reviewed by:  []  , Pharm.D. []  Enzo Bi, Pharm.D., BCPS AQ-ID []  , Pharm.D., BCPS []  Celedonio Miyamoto, Pharm.D., BCPS []  [place], Garvin Fila.D., BCPS, AAHIVP []  , Pharm.D., BCPS, AAHIVP []  Georgina Pillion, PharmD, BCPS []  , PharmD, BCPS []  [place], PharmD, BCPS [x]  [place], PharmD  Positive urine culture  [x]  Patient discharged without antimicrobial prescription and treatment is now indicated []  Organism is resistant to prescribed ED discharge antimicrobial []  Patient with positive blood cultures  Changes discussed with ED provider: PA New antibiotic prescription Cephalexin 500 mg PO TID x three days Called to [facility] [phone])  Contacted patient, date 06/23/2020, time 1415   06/23/2020, 3:49 PM

## 2020-07-04 ENCOUNTER — Ambulatory Visit: Payer: Medicaid Other | Admitting: Neurology

## 2020-07-04 ENCOUNTER — Telehealth: Payer: Self-pay | Admitting: Neurology

## 2020-07-04 ENCOUNTER — Encounter: Payer: Self-pay | Admitting: Neurology

## 2020-07-04 VITALS — BP 120/79 | HR 96 | Ht 63.0 in | Wt 304.0 lb

## 2020-07-04 DIAGNOSIS — G43709 Chronic migraine without aura, not intractable, without status migrainosus: Secondary | ICD-10-CM

## 2020-07-04 DIAGNOSIS — G932 Benign intracranial hypertension: Secondary | ICD-10-CM

## 2020-07-04 MED ORDER — ONDANSETRON 4 MG PO TBDP: 4.0000 mg | ORAL_TABLET | Freq: Three times a day (TID) | ORAL | 6 refills | Status: DC | PRN

## 2020-07-04 MED ORDER — RIZATRIPTAN BENZOATE 10 MG PO TBDP: 10.0000 mg | ORAL_TABLET | ORAL | 6 refills | Status: DC | PRN

## 2020-07-04 MED ORDER — TOPIRAMATE 100 MG PO TABS: 100.0000 mg | ORAL_TABLET | Freq: Two times a day (BID) | ORAL | 11 refills | Status: DC

## 2020-07-04 NOTE — Progress Notes (Addendum)
Chief Complaint  Patient presents with  . New Patient (Initial Visit)    She is here with her significant other, Savannah Parker. Recent eye exam showing papilledema. She estimates three headache days weekly. She also hears "water swooshing" in her bilateral ears.       ASSESSMENT AND PLAN  Savannah Parker is a 20 y.o. 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  Laboratory evaluation in 2022: CMP, mildly decreased calcium 8.5, CBC showed hemoglobin of 12.4,  HISTORICAL  Savannah Parker is a 20 year old female, seen in request by her optometrist Dr. Fonnie Parker, for evaluation of bilateral papillary edema, Her primary care physician is nurse practitioner Savannah Parker, Savannah Parker, initial evaluation was on July 04, 2020.  I reviewed and summarized the referring note.  She reported a history of occasional headaches since middle school, but increased frequency since 2020, retro-orbital area, sometimes lateralized severe pounding headache with associated light, noise sensitivity, nauseous, movement made her headache worse, it can last hours or even 1 weeks,  Because of increased frequency of her headache, over the past couple years, she has been taking daily Tylenol up to 10 to 12 tablets, along with Excedrin Migraine and ibuprofen  Trigger for her migraine headaches are stress, sleep deprivation, exertion, dehydration, missing meals, weather  changes, certain processing food  Last eye examination was 2019, on April 23, 2020, she was seen by optometrist Savannah Parker, Savannah Parker, there was evidence of bilateral papillary edema,  She also has weight fluctuation, obesity, diagnosis of diabetes,  REVIEW OF SYSTEMS: Full 14 system review of systems performed and notable only for as above All other review of systems were negative.  PHYSICAL EXAM   Vitals:   07/04/20 0811  BP: 120/79  Pulse: 96  Weight: (!) 304 lb (137.9 kg)  Height: _0  (1.6 m)   Not recorded     Body mass index is 53.85 kg/m.  PHYSICAL EXAMNIATION:  Gen: NAD, conversant, well nourised, well groomed                     Cardiovascular: Regular rate rhythm, no peripheral edema, warm, nontender. Eyes: Conjunctivae clear without exudates or hemorrhage Neck: Supple, no carotid bruits. Pulmonary: Clear to auscultation bilaterally   NEUROLOGICAL EXAM:  MENTAL STATUS: Speech:    Speech is normal; fluent and spontaneous with normal comprehension.  Cognition:     Orientation to time, place and person     Normal recent and remote memory     Normal Attention span and concentration     Normal Language, naming, repeating,spontaneous speech     Fund of knowledge   CRANIAL NERVES: CN II: Visual fields are full to confrontation. Pupils are round equal and briskly reactive to light. CN III, IV, VI: extraocular movement are normal. No ptosis. CN V: Facial sensation is intact to light touch CN VII: Face is symmetric with normal eye closure  CN VIII: Hearing is normal to causal conversation. CN IX,  X: Phonation is normal. CN XI: Head turning and shoulder shrug are intact  MOTOR: There is no pronator drift of out-stretched arms. Muscle bulk and tone are normal. Muscle strength is normal.  REFLEXES: Reflexes are 2+ and symmetric at the biceps, triceps, knees, and ankles. Plantar responses are flexor.  SENSORY: Intact to light touch, pinprick and vibratory  sensation are intact in fingers and toes.  COORDINATION: There is no trunk or limb dysmetria noted.  GAIT/STANCE: Posture is normal. Gait is steady with limited by her big body habitus Romberg is absent.  ALLERGIES: Allergies  Allergen Reactions  . Onion Swelling  . Other Other (See Comments)    Green pepper - unknown    HOME MEDICATIONS: Current Outpatient Medications  Medication Sig Dispense Refill  . ACCU-CHEK GUIDE test strip ACCU-CHECK GUIDE TEST STRIPS. USE DAILY AS INSTRUCTED 100 strip 12  . Accu-Chek Softclix Lancets lancets Use as instructed 100 each 12  . acetaminophen (TYLENOL) 500 MG tablet Take 1,000 mg by mouth every 6 (six) hours as needed for moderate pain.    Marland Kitchen albuterol (VENTOLIN HFA) 108 (90 Base) MCG/ACT inhaler Inhale into the lungs every 6 (six) hours as needed for wheezing or shortness of breath.    . blood glucose meter kit and supplies Dispense based on patient and insurance preference. Use daily to monitor blood sugar. (E11.65) 1 each 0  . dicyclomine (BENTYL) 20 MG tablet Take 1 tablet (20 mg total) by mouth 2 (two) times daily. 20 tablet 0  . metFORMIN (GLUCOPHAGE) 500 MG tablet Take 1 tablet (500 mg total) by mouth 2 (two) times daily with a meal. 180 tablet 3  . ondansetron (ZOFRAN ODT) 4 MG disintegrating tablet Take 1 tablet (4 mg total) by mouth every 8 (eight) hours as needed for nausea or vomiting. 20 tablet 0  . sertraline (ZOLOFT) 50 MG tablet TAKE 1 TABLET BY MOUTH EVERY DAY (Patient taking differently: Take 50 mg by mouth daily.) 90 tablet 2  . traMADol (ULTRAM) 50 MG tablet Take 1 every 6-8 hours for pain not helped by Tylenol or Motrin 10 tablet 0  . UNABLE TO FIND Accu-check guide monitor system 1 each 0   No current facility-administered medications for this visit.    PAST MEDICAL HISTORY: Past Medical History:  Diagnosis Date  . Anxiety    [employer]reesia 04/19/2020  . Back pain   . Depression   . Diabetes mellitus without complication  ([place])   . Papilledema     PAST SURGICAL HISTORY: Past Surgical History:  Procedure Laterality Date  . WISDOM TOOTH EXTRACTION Bilateral    2 removed    FAMILY HISTORY: Family History  Problem Relation Age of Onset  . Heart failure Mother   . Hypertension Mother   . Heart disease Mother   . Kidney disease Mother   . Aortic aneurysm Mother   . Sickle cell anemia Father     SOCIAL HISTORY: Social History   Socioeconomic History  . Marital status: Single    Spouse name: Not on file  . Number of children: 0  . Years of education: college  . Highest education level: Associate degree: academic program  Occupational History  . Occupation: personal care  Tobacco Use  . Smoking status: Current Every Day Smoker    Packs/day: 0.25    Types: Cigarettes  . Smokeless tobacco: Never Used  Vaping Use  . Vaping Use: Never used  Substance and Sexual Activity  . Alcohol use: Yes  Comment: rarely  . Drug use: No  . Sexual activity: Yes    Birth control/protection: None  Other Topics Concern  . Not on file  Social History Narrative   Lives with significant other.   Right-handed.   No daily use of caffeine.   Social Determinants of Health   Financial Resource Strain: Not on file  Food Insecurity: Not on file  Transportation Needs: Not on file  Physical Activity: Not on file  Stress: Not on file  Social Connections: Not on file  Intimate Partner Violence: Not on file      Marcial Pacas, M.D. Ph.D.  [facility] [address], [place], [place] [address] Ph: [phone] Fax: [phone]  CC:  Savannah Parker, [place],  Page [phone]  Noreene Larsson, NP

## 2020-07-04 NOTE — Telephone Encounter (Signed)
It can take up to 15 business days to hear back.

## 2020-07-04 NOTE — Telephone Encounter (Signed)
mcd healthy blue pending uploaded notes on the portal 

## 2020-07-05 LAB — CBC WITH DIFFERENTIAL/PLATELET
Basophils Absolute: 0 10*3/uL (ref 0.0–0.2)
Basos: 0 %
EOS (ABSOLUTE): 0.3 10*3/uL (ref 0.0–0.4)
Eos: 4 %
Hematocrit: 38.9 % (ref 34.0–46.6)
Hemoglobin: 12.7 g/dL (ref 11.1–15.9)
Immature Grans (Abs): 0 10*3/uL (ref 0.0–0.1)
Immature Granulocytes: 0 %
Lymphocytes Absolute: 2.3 10*3/uL (ref 0.7–3.1)
Lymphs: 28 %
MCH: 29 pg (ref 26.6–33.0)
MCHC: 32.6 g/dL (ref 31.5–35.7)
MCV: 89 fL (ref 79–97)
Monocytes Absolute: 0.4 10*3/uL (ref 0.1–0.9)
Monocytes: 5 %
Neutrophils Absolute: 5.1 10*3/uL (ref 1.4–7.0)
Neutrophils: 63 %
Platelets: 447 10*3/uL (ref 150–450)
RBC: 4.38 x10E6/uL (ref 3.77–5.28)
RDW: 13.4 % (ref 11.7–15.4)
WBC: 8.2 10*3/uL (ref 3.4–10.8)

## 2020-07-05 LAB — RPR: RPR Ser Ql: NONREACTIVE

## 2020-07-05 LAB — COMPREHENSIVE METABOLIC PANEL
ALT: 22 IU/L (ref 0–32)
AST: 19 IU/L (ref 0–40)
Albumin/Globulin Ratio: 0.9 — ABNORMAL LOW (ref 1.2–2.2)
Albumin: 3.7 g/dL — ABNORMAL LOW (ref 3.9–5.0)
Alkaline Phosphatase: 69 IU/L (ref 44–121)
BUN/Creatinine Ratio: 8 — ABNORMAL LOW (ref 9–23)
BUN: 5 mg/dL — ABNORMAL LOW (ref 6–20)
Bilirubin Total: 0.2 mg/dL (ref 0.0–1.2)
CO2: 21 mmol/L (ref 20–29)
Calcium: 9.1 mg/dL (ref 8.7–10.2)
Chloride: 100 mmol/L (ref 96–106)
Creatinine, Ser: 0.6 mg/dL (ref 0.57–1.00)
Globulin, Total: 3.9 g/dL (ref 1.5–4.5)
Glucose: 117 mg/dL — ABNORMAL HIGH (ref 65–99)
Potassium: 4.6 mmol/L (ref 3.5–5.2)
Sodium: 139 mmol/L (ref 134–144)
Total Protein: 7.6 g/dL (ref 6.0–8.5)
eGFR: 131 mL/min/{1.73_m2} (ref >59)

## 2020-07-05 LAB — TSH: TSH: 0.862 u[IU]/mL (ref 0.450–4.500)

## 2020-07-05 LAB — VITAMIN B12: Vitamin B-12: 481 pg/mL (ref 232–1245)

## 2020-07-05 LAB — SEDIMENTATION RATE: Sed Rate: 26 mm/hr (ref 0–32)

## 2020-07-05 LAB — VITAMIN D 25 HYDROXY (VIT D DEFICIENCY, FRACTURES): Vit D, 25-Hydroxy: 7.8 ng/mL — ABNORMAL LOW (ref 30.0–100.0)

## 2020-07-05 LAB — HIV ANTIBODY (ROUTINE TESTING W REFLEX): HIV Screen 4th Generation wRfx: NONREACTIVE

## 2020-07-05 LAB — ANA W/REFLEX IF POSITIVE: Anti Nuclear Antibody (ANA): NEGATIVE

## 2020-07-08 ENCOUNTER — Telehealth: Payer: Self-pay | Admitting: Neurology

## 2020-07-08 DIAGNOSIS — E559 Vitamin D deficiency, unspecified: Secondary | ICD-10-CM

## 2020-07-08 MED ORDER — VITAMIN D (ERGOCALCIFEROL) 1.25 MG (50000 UNIT) PO CAPS: 50000.0000 [IU] | ORAL_CAPSULE | ORAL | 0 refills | Status: DC

## 2020-07-08 NOTE — Telephone Encounter (Signed)
I spoke to the patient and provided her with the lab results. She verbalized understanding to start the prescribed vitamin D once weekly for five weeks then start the recommended OTC daily supplement.

## 2020-07-08 NOTE — Telephone Encounter (Signed)
Left second message for a return call. 

## 2020-07-08 NOTE — Telephone Encounter (Signed)
mcd healthy blue Berkley Harvey: [id number] (exp. 07/04/20 to 09/02/20) order sent to GI. They will reach out to the patient to schedule.

## 2020-07-08 NOTE — Telephone Encounter (Signed)
Please call patient, laboratory evaluation showed severely decreased vitamin D level 7.8,  I have called prescription vitamin D 50,000 units 1 capsule every 7 days, for 5 weeks  Then followed by over-the-counter vitamin D3 1000 units, 2 tablets daily  Meds ordered this encounter  Medications  . Vitamin D, Ergocalciferol, (DRISDOL) 1.25 MG (50000 UNIT) CAPS capsule    Sig: Take 1 capsule (50,000 Units total) by mouth every 7 (seven) days.    Dispense:  5 capsule    Refill:  0

## 2020-07-08 NOTE — Telephone Encounter (Signed)
Left message requesting a call back.

## 2020-07-15 ENCOUNTER — Ambulatory Visit: Payer: Medicaid Other | Admitting: Nurse Practitioner

## 2020-07-18 ENCOUNTER — Encounter (HOSPITAL_COMMUNITY): Payer: Self-pay

## 2020-07-18 ENCOUNTER — Emergency Department (HOSPITAL_COMMUNITY): Payer: Medicaid Other

## 2020-07-18 ENCOUNTER — Other Ambulatory Visit: Payer: Self-pay

## 2020-07-18 ENCOUNTER — Other Ambulatory Visit: Payer: Medicaid Other

## 2020-07-18 ENCOUNTER — Emergency Department (HOSPITAL_COMMUNITY)
Admission: EM | Admit: 2020-07-18 | Discharge: 2020-07-19 | Disposition: A | Discharge status: Home / Self Care | Payer: Medicaid Other | Attending: Emergency Medicine | Admitting: Emergency Medicine

## 2020-07-18 DIAGNOSIS — R55 Syncope and collapse: Secondary | ICD-10-CM | POA: Diagnosis not present

## 2020-07-18 DIAGNOSIS — Z7984 Long term (current) use of oral hypoglycemic drugs: Secondary | ICD-10-CM | POA: Insufficient documentation

## 2020-07-18 DIAGNOSIS — G43409 Hemiplegic migraine, not intractable, without status migrainosus: Secondary | ICD-10-CM | POA: Insufficient documentation

## 2020-07-18 DIAGNOSIS — R200 Anesthesia of skin: Secondary | ICD-10-CM | POA: Diagnosis present

## 2020-07-18 DIAGNOSIS — A599 Trichomoniasis, unspecified: Secondary | ICD-10-CM | POA: Diagnosis not present

## 2020-07-18 DIAGNOSIS — E119 Type 2 diabetes mellitus without complications: Secondary | ICD-10-CM | POA: Insufficient documentation

## 2020-07-18 DIAGNOSIS — E876 Hypokalemia: Secondary | ICD-10-CM | POA: Diagnosis not present

## 2020-07-18 DIAGNOSIS — F17210 Nicotine dependence, cigarettes, uncomplicated: Secondary | ICD-10-CM | POA: Insufficient documentation

## 2020-07-18 LAB — URINALYSIS, ROUTINE W REFLEX MICROSCOPIC
Bilirubin Urine: NEGATIVE
Glucose, UA: NEGATIVE mg/dL
Hgb urine dipstick: NEGATIVE
Ketones, ur: NEGATIVE mg/dL
Nitrite: NEGATIVE
Protein, ur: NEGATIVE mg/dL
Specific Gravity, Urine: 1.012 (ref 1.005–1.030)
WBC, UA: >50 WBC/hpf — ABNORMAL HIGH (ref 0–5)
pH: 6 (ref 5.0–8.0)

## 2020-07-18 LAB — CBG MONITORING, ED: Glucose-Capillary: 119 mg/dL — ABNORMAL HIGH (ref 70–99)

## 2020-07-18 LAB — CBC
HCT: 37.8 % (ref 36.0–46.0)
Hemoglobin: 12.2 g/dL (ref 12.0–15.0)
MCH: 28.8 pg (ref 26.0–34.0)
MCHC: 32.3 g/dL (ref 30.0–36.0)
MCV: 89.2 fL (ref 80.0–100.0)
Platelets: 432 10*3/uL — ABNORMAL HIGH (ref 150–400)
RBC: 4.24 MIL/uL (ref 3.87–5.11)
RDW: 13.6 % (ref 11.5–15.5)
WBC: 10.5 10*3/uL (ref 4.0–10.5)
nRBC: 0 % (ref 0.0–0.2)

## 2020-07-18 LAB — TROPONIN I (HIGH SENSITIVITY)
Troponin I (High Sensitivity): <2 ng/L (ref <18)
Troponin I (High Sensitivity): <2 ng/L (ref <18)

## 2020-07-18 LAB — BASIC METABOLIC PANEL
Anion gap: 7 (ref 5–15)
BUN: 7 mg/dL (ref 6–20)
CO2: 24 mmol/L (ref 22–32)
Calcium: 8.8 mg/dL — ABNORMAL LOW (ref 8.9–10.3)
Chloride: 108 mmol/L (ref 98–111)
Creatinine, Ser: 0.72 mg/dL (ref 0.44–1.00)
GFR, Estimated: >60 mL/min (ref >60)
Glucose, Bld: 115 mg/dL — ABNORMAL HIGH (ref 70–99)
Potassium: 3 mmol/L — ABNORMAL LOW (ref 3.5–5.1)
Sodium: 139 mmol/L (ref 135–145)

## 2020-07-18 LAB — I-STAT BETA HCG BLOOD, ED (MC, WL, AP ONLY): I-stat hCG, quantitative: <5 m[IU]/mL (ref <5)

## 2020-07-18 MED ORDER — POTASSIUM CHLORIDE CRYS ER 20 MEQ PO TBCR: 40.0000 meq | EXTENDED_RELEASE_TABLET | Freq: Every day | ORAL | 0 refills | Status: DC

## 2020-07-18 MED ORDER — POTASSIUM CHLORIDE 10 MEQ/100ML IV SOLN
10.0000 meq | INTRAVENOUS | Status: AC
  Administered 2020-07-18 (×2): 10 meq via INTRAVENOUS
  Filled 2020-07-18 (×2): qty 100

## 2020-07-18 MED ORDER — METOCLOPRAMIDE HCL 5 MG/ML IJ SOLN
10.0000 mg | Freq: Once | INTRAMUSCULAR | Status: AC
  Administered 2020-07-18: 10 mg via INTRAVENOUS
  Filled 2020-07-18: qty 2

## 2020-07-18 MED ORDER — DEXAMETHASONE SODIUM PHOSPHATE 10 MG/ML IJ SOLN
10.0000 mg | Freq: Once | INTRAMUSCULAR | Status: AC
  Administered 2020-07-19: 10 mg via INTRAVENOUS
  Filled 2020-07-18: qty 1

## 2020-07-18 MED ORDER — GADOBUTROL 1 MMOL/ML IV SOLN
10.0000 mL | Freq: Once | INTRAVENOUS | Status: AC | PRN
  Administered 2020-07-18: 10 mL via INTRAVENOUS

## 2020-07-18 MED ORDER — LORAZEPAM 2 MG/ML IJ SOLN
1.0000 mg | Freq: Once | INTRAMUSCULAR | Status: DC | PRN
  Filled 2020-07-18: qty 1

## 2020-07-18 MED ORDER — DIPHENHYDRAMINE HCL 50 MG/ML IJ SOLN
25.0000 mg | Freq: Once | INTRAMUSCULAR | Status: AC
  Administered 2020-07-18: 25 mg via INTRAVENOUS
  Filled 2020-07-18: qty 1

## 2020-07-18 MED ORDER — IOHEXOL 350 MG/ML SOLN
100.0000 mL | Freq: Once | INTRAVENOUS | Status: AC | PRN
  Administered 2020-07-18: 100 mL via INTRAVENOUS

## 2020-07-18 MED ORDER — POTASSIUM CHLORIDE CRYS ER 20 MEQ PO TBCR
40.0000 meq | EXTENDED_RELEASE_TABLET | Freq: Once | ORAL | Status: AC
  Administered 2020-07-18: 40 meq via ORAL
  Filled 2020-07-18: qty 2

## 2020-07-18 NOTE — ED Notes (Signed)
Unable to update vitals at this time due to patient being at MRI

## 2020-07-18 NOTE — ED Triage Notes (Signed)
Patient c/o feeling like she is going to pass out for the past 3 1/2 hours. Patient c/o dizziness and feeling a sharp pain in her left arm and numbness of the left hand.

## 2020-07-18 NOTE — Discharge Instructions (Signed)
1.  Your potassium was low today.  Take potassium supplement as prescribed for the next week.  This needs to be rechecked by your doctor within 5 to 7 days. 2.  Your MRIs and CT scans did not show any evidence of a stroke.  Migraines can present with paralysis that mimics a stroke.  At this time, that seems like the most likely cause for your symptoms. 3.  Follow-up with your neurologist within 7 to 10 days.

## 2020-07-18 NOTE — ED Notes (Signed)
Patient transported to CT 

## 2020-07-18 NOTE — ED Notes (Signed)
Patient transported to MRI 

## 2020-07-18 NOTE — Progress Notes (Signed)
Chaplain responded to page sent at request from pt and her fiance who wished to speak w/ the Chaplain.  Chaplain offered ministry of presence and caring as fiance spoke of the two of them being under a lot of stress and asked for prayers.  Chaplain prayed at bedside.  Vernell Morgans Chaplain

## 2020-07-18 NOTE — ED Provider Notes (Addendum)
MSE was initiated and I personally evaluated the patient and placed orders (if any) at  6:56 PM on July 18, 2020.  The patient appears stable so that the remainder of the MSE may be completed by another provider.  Patient placed in Quick Look pathway, seen and evaluated   Chief Complaint: Dizziness, lightheadedness, left upper extremity numbness, chest pain  HPI:   20 year old female who presents for evaluation of dizziness, lightheadedness, left upper extremity numbness, chest pain that began about 3 and half hours prior to arrival.  Patient reports that she started feeling lightheaded like she was going to pass out.  She does not have any room spinning sensation fully she was off balance.  She also reports he has had some intermittent left-sided chest pain that has been going on.  She also reports that after the symptoms started, she had some tingling from her left upper extremity.  She reports that this extends from her shoulder all the way down to her hand.  No weakness.  No numbness/weakness of her legs.  ROS: lightheaded, numbness, chest pain  Physical Exam:   Gen: No distress  Neuro: Awake and Alert  Skin: Warm    Focused Exam: 2+ radial pulse bilaterally, RRR, CTAB, No nystagmus. Cranial nerves III-XII intact Follows commands, Moves all extremities  5/5 strength to BUE and BLE  Reports decreased to LUE. Sensation intact throughout all major nerve distributions No slurred speech. No facial droop.   I notified triage RN that patient needed to be evaluated in the ED immediately.    Initiation of care has begun. The patient has been counseled on the process, plan, and necessity for staying for the completion/evaluation, and the remainder of the medical screening examination  Portions of this note were generated with Dragon dictation software. Dictation errors may occur despite best attempts at proofreading.     Maxwell Caul, PA-C 07/18/20 1859    Maxwell Caul,  PA-C 07/18/20 1859    Milagros Loll, MD 07/18/20 2018

## 2020-07-18 NOTE — ED Provider Notes (Signed)
[facility] DEPT Provider Note   CSN: [id number] Arrival date & time: 07/18/20  1834     History Chief Complaint  Patient presents with  . Near Syncope  . Dizziness  . Numbness    Savannah Parker is a 20 y.o. female.  HPI She reports she has a long history of headaches and dizziness.  She has been diagnosed with migraines and takes Topamax twice daily.  She reports it does not help much because she has almost daily headaches anyways.  Headaches are pressure across the front of her head and top of her head.  She reports her typical headaches may have spots in the vision.  She is also been prescribed Maxalt.  She reports it has not been helpful and she is only tried it a couple of times.  She was supposed to get an MRI done today for continued work-up of her headaches.  Patient had to reschedule because she ended up having to take her daughter to an appointment and thus could not go to her MRI.  Patient had been seen by ophthalmology and diagnosed with papilledema.  There was concern for possible pseudotumor cerebri and her neurologist was in the process of working this up.  Patient reports that this morning at about 830 she was out shopping at the grocery store with her husband.  She reports she had just gotten to the store and she suddenly felt much worse.  She got a feeling of increased dizziness and perception that she might pass out.  She had her husband take her outside.  She felt a little bit better out in the cool breeze.  She where she was having some spots in her vision.  She reports she then felt very weak in her legs.  She reports she is used to being a little bit unsteady on the right because she has a bad ankle but the left seems more weak as well.  Symptoms abated somewhat but then she reported a couple hours later may be 1030 or so she felt weak and unsteady in the legs.  Ultimately, symptoms abated enough that she ended up taking her daughter to a medical  appointment.  She reports then, about noon or 1 she reports things felt different and then she felt numbness in her left face, arm and leg.  Reports she was having a sharp pain that seem to be radiating around her left shoulder and thorax.  Along with this was the sensation of pain into her extremities along with numbness and weakness.  This was not a typical symptom for her.    Past Medical History:  Diagnosis Date  . Anxiety    [employer]reesia 04/19/2020  . Back pain   . Depression   . Diabetes mellitus without complication ([place])   . Papilledema     Patient Active Problem List   Diagnosis Date Noted  . Vitamin D deficiency 07/08/2020  . Chronic migraine w/o aura w/o status migrainosus, not intractable 07/04/2020  . Pseudotumor cerebri 07/04/2020  . Encounter to establish care 04/22/2020  . Screening due 04/22/2020  . Type II diabetes mellitus, uncontrolled ([place]) 04/22/2020  . Morbid obesity ([place]) 04/22/2020  . Anxiety and depression 04/22/2020    Past Surgical History:  Procedure Laterality Date  . WISDOM TOOTH EXTRACTION Bilateral    2 removed     OB History   No obstetric history on file.     Family History  Problem Relation Age of Onset  .  Heart failure Mother   . Hypertension Mother   . Heart disease Mother   . Kidney disease Mother   . Aortic aneurysm Mother   . Sickle cell anemia Father     Social History   Tobacco Use  . Smoking status: Current Every Day Smoker    Packs/day: 0.25    Types: Cigarettes  . Smokeless tobacco: Never Used  Vaping Use  . Vaping Use: Never used  Substance Use Topics  . Alcohol use: Yes    Comment: rarely  . Drug use: No    Home Medications Prior to Admission medications   Medication Sig Start Date End Date Taking? Authorizing Provider  ACCU-CHEK GUIDE test strip ACCU-CHECK GUIDE TEST STRIPS. USE DAILY AS INSTRUCTED 05/21/20   Noreene Larsson, NP  Accu-Chek Softclix Lancets lancets Use as instructed 05/20/20   Noreene Larsson, NP   acetaminophen (TYLENOL) 500 MG tablet Take 1,000 mg by mouth every 6 (six) hours as needed for moderate pain.    [provider]  albuterol (VENTOLIN HFA) 108 (90 Base) MCG/ACT inhaler Inhale into the lungs every 6 (six) hours as needed for wheezing or shortness of breath.    [provider]  blood glucose meter kit and supplies Dispense based on patient and insurance preference. Use daily to monitor blood sugar. (E11.65) 05/21/20   Noreene Larsson, NP  dicyclomine (BENTYL) 20 MG tablet Take 1 tablet (20 mg total) by mouth 2 (two) times daily. 06/19/20   Henderly, Britni A, PA-C  metFORMIN (GLUCOPHAGE) 500 MG tablet Take 1 tablet (500 mg total) by mouth 2 (two) times daily with a meal. 04/22/20   Noreene Larsson, NP  ondansetron (ZOFRAN ODT) 4 MG disintegrating tablet Take 1 tablet (4 mg total) by mouth every 8 (eight) hours as needed for nausea or vomiting. 06/19/20   Henderly, Britni A, PA-C  ondansetron (ZOFRAN ODT) 4 MG disintegrating tablet Take 1 tablet (4 mg total) by mouth every 8 (eight) hours as needed. 07/04/20   Marcial Pacas, MD  rizatriptan (MAXALT-MLT) 10 MG disintegrating tablet Take 1 tablet (10 mg total) by mouth as needed. May repeat in 2 hours if needed 07/04/20   Marcial Pacas, MD  sertraline (ZOLOFT) 50 MG tablet TAKE 1 TABLET BY MOUTH EVERY DAY Patient taking differently: Take 50 mg by mouth daily. 05/14/20   Noreene Larsson, NP  topiramate (TOPAMAX) 100 MG tablet Take 1 tablet (100 mg total) by mouth 2 (two) times daily. 07/04/20   Marcial Pacas, MD  traMADol Veatrice Bourbon) 50 MG tablet Take 1 every 6-8 hours for pain not helped by Tylenol or Motrin 07/10/17   Milton Ferguson, MD  UNABLE TO FIND Accu-check guide monitor system 05/20/20   Noreene Larsson, NP  Vitamin D, Ergocalciferol, (DRISDOL) 1.25 MG (50000 UNIT) CAPS capsule Take 1 capsule (50,000 Units total) by mouth every 7 (seven) days. 07/08/20   Marcial Pacas, MD    Allergies    Onion and Other  Review of Systems   Review of  Systems 10 systems reviewed and negative except as per HPI Physical Exam Updated Vital Signs BP (!) 117/104   Pulse 79   Temp 98.8 F (37.1 C) (Oral)   Resp 19   Ht 5' 3"  (1.6 m)   Wt (!) 138.3 kg   SpO2 97%   BMI 54.03 kg/m   Physical Exam Constitutional:      Comments: Alert and nontoxic.  No respiratory distress.  Mental status clear.  HENT:     Head: Normocephalic and atraumatic.     Nose: Nose normal.     Mouth/Throat:     Mouth: Mucous membranes are moist.     Pharynx: Oropharynx is clear.  Eyes:     Extraocular Movements: Extraocular movements intact.     Conjunctiva/sclera: Conjunctivae normal.     Pupils: Pupils are equal, round, and reactive to light.  Cardiovascular:     Rate and Rhythm: Normal rate and regular rhythm.  Pulmonary:     Effort: Pulmonary effort is normal.     Breath sounds: Normal breath sounds.  Abdominal:     General: There is no distension.     Palpations: Abdomen is soft.     Tenderness: There is no abdominal tenderness. There is no guarding.  Musculoskeletal:        General: No swelling or tenderness. Normal range of motion.     Cervical back: Neck supple.     Right lower leg: No edema.     Left lower leg: No edema.  Skin:    General: Skin is warm and dry.  Neurological:     Comments: Patient is alert.  Speech is clear.  Cognitive function intact.  No aphasia.  Cranial nerves II through XII intact.  Patient can perform finger-nose on the left.  She currently has an IV in the antecubital fossa on the right and finds it difficult to perform finger-nose due to pain at the IV site bending.  Strength on the right is 5\5.  Grip strength on the left is 3-4 out of 5.  Patient can elevate and hold right lower extremity off the bed against resistance.  Patient requires significant effort to hold the left lower extremity off of the bed.  She can elevate and hold but with significant effort.  Cannot hold against resistance.  Patient endorses that  differential to light touch on the left and the right upper and lower extremity.  Perceiving touch on the left to feel more like a paresthesia.     ED Results / Procedures / Treatments   Labs (all labs ordered are listed, but only abnormal results are displayed) Labs Reviewed  BASIC METABOLIC PANEL - Abnormal; Notable for the following components:      Result Value   Potassium 3.0 (*)    Glucose, Bld 115 (*)    Calcium 8.8 (*)    All other components within normal limits  CBC - Abnormal; Notable for the following components:   Platelets 432 (*)    All other components within normal limits  URINALYSIS, ROUTINE W REFLEX MICROSCOPIC - Abnormal; Notable for the following components:   APPearance CLOUDY (*)    Leukocytes,Ua LARGE (*)    WBC, UA >50 (*)    Bacteria, UA RARE (*)    Trichomonas, UA PRESENT (*)    All other components within normal limits  CBG MONITORING, ED - Abnormal; Notable for the following components:   Glucose-Capillary 119 (*)    All other components within normal limits  I-STAT BETA HCG BLOOD, ED ([facility], [facility], [facility] ONLY)  TROPONIN I (HIGH SENSITIVITY)  TROPONIN I (HIGH SENSITIVITY)    EKG EKG Interpretation  Date/Time:  Thursday July 18 2020 19:08:09 EDT Ventricular Rate:  87 PR Interval:  168 QRS Duration: 66 QT Interval:  367 QTC Calculation: 442 R Axis:   68 Text Interpretation: Sinus rhythm no acute ischemic appearance. Confirmed by Charlesetta Shanks [phone]) on 07/18/2020 8:24:08 PM   Radiology CT  Angio Head W or Wo Contrast  Result Date: 07/18/2020 CLINICAL DATA:  Dizziness with left hand numbness EXAM: CT ANGIOGRAPHY HEAD AND NECK TECHNIQUE: Multidetector CT imaging of the head and neck was performed using the standard protocol during bolus administration of intravenous contrast. Multiplanar CT image reconstructions and MIPs were obtained to evaluate the vascular anatomy. Carotid stenosis measurements (when applicable) are obtained utilizing NASCET  criteria, using the distal internal carotid diameter as the denominator. CONTRAST:  164m OMNIPAQUE IOHEXOL 350 MG/ML SOLN COMPARISON:  None. FINDINGS: CTA NECK FINDINGS SKELETON: There is no bony spinal canal stenosis. No lytic or blastic lesion. OTHER NECK: Normal pharynx, larynx and major salivary glands. No cervical lymphadenopathy. Unremarkable thyroid gland. UPPER CHEST: No pneumothorax or pleural effusion. No nodules or masses. AORTIC ARCH: There is no calcific atherosclerosis of the aortic arch. There is no aneurysm, dissection or hemodynamically significant stenosis of the visualized portion of the aorta. Conventional 3 vessel aortic branching pattern. The visualized proximal subclavian arteries are widely patent. RIGHT CAROTID SYSTEM: Normal without aneurysm, dissection or stenosis. LEFT CAROTID SYSTEM: Normal without aneurysm, dissection or stenosis. VERTEBRAL ARTERIES: Left dominant configuration. Both origins are clearly patent. There is no dissection, occlusion or flow-limiting stenosis to the skull base (V1-V3 segments). CTA HEAD FINDINGS POSTERIOR CIRCULATION: --Vertebral arteries: Normal V4 segments. --Inferior cerebellar arteries: Normal. --Basilar artery: Normal. --Superior cerebellar arteries: Normal. --Posterior cerebral arteries (PCA): Normal. ANTERIOR CIRCULATION: --Intracranial internal carotid arteries: Normal. --Anterior cerebral arteries (ACA): Normal. Both A1 segments are present. Patent anterior communicating artery (a-comm). --Middle cerebral arteries (MCA): Normal. ANATOMIC VARIANTS: None Review of the MIP images confirms the above findings. IMPRESSION: Normal CTA of the head and neck. Electronically Signed   By: KUlyses JarredM.D.   On: 07/18/2020 23:09   CT Head Wo Contrast  Result Date: 07/18/2020 CLINICAL DATA:  Dizziness EXAM: CT HEAD WITHOUT CONTRAST TECHNIQUE: Contiguous axial images were obtained from the base of the skull through the vertex without intravenous contrast.  COMPARISON:  CT report 12/22/2017 FINDINGS: Brain: No evidence of acute infarction, hemorrhage, hydrocephalus, extra-axial collection or mass lesion/mass effect. Vascular: No hyperdense vessel or unexpected calcification. Skull: Normal. Negative for fracture or focal lesion. Sinuses/Orbits: No acute finding. Other: None IMPRESSION: Negative non contrasted CT appearance of the brain. Electronically Signed   By: KDonavan FoilM.D.   On: 07/18/2020 19:23   CT Angio Neck W and/or Wo Contrast  Result Date: 07/18/2020 CLINICAL DATA:  Dizziness with left hand numbness EXAM: CT ANGIOGRAPHY HEAD AND NECK TECHNIQUE: Multidetector CT imaging of the head and neck was performed using the standard protocol during bolus administration of intravenous contrast. Multiplanar CT image reconstructions and MIPs were obtained to evaluate the vascular anatomy. Carotid stenosis measurements (when applicable) are obtained utilizing NASCET criteria, using the distal internal carotid diameter as the denominator. CONTRAST:  1051mOMNIPAQUE IOHEXOL 350 MG/ML SOLN COMPARISON:  None. FINDINGS: CTA NECK FINDINGS SKELETON: There is no bony spinal canal stenosis. No lytic or blastic lesion. OTHER NECK: Normal pharynx, larynx and major salivary glands. No cervical lymphadenopathy. Unremarkable thyroid gland. UPPER CHEST: No pneumothorax or pleural effusion. No nodules or masses. AORTIC ARCH: There is no calcific atherosclerosis of the aortic arch. There is no aneurysm, dissection or hemodynamically significant stenosis of the visualized portion of the aorta. Conventional 3 vessel aortic branching pattern. The visualized proximal subclavian arteries are widely patent. RIGHT CAROTID SYSTEM: Normal without aneurysm, dissection or stenosis. LEFT CAROTID SYSTEM: Normal without aneurysm, dissection or stenosis. VERTEBRAL ARTERIES: Left  dominant configuration. Both origins are clearly patent. There is no dissection, occlusion or flow-limiting stenosis to  the skull base (V1-V3 segments). CTA HEAD FINDINGS POSTERIOR CIRCULATION: --Vertebral arteries: Normal V4 segments. --Inferior cerebellar arteries: Normal. --Basilar artery: Normal. --Superior cerebellar arteries: Normal. --Posterior cerebral arteries (PCA): Normal. ANTERIOR CIRCULATION: --Intracranial internal carotid arteries: Normal. --Anterior cerebral arteries (ACA): Normal. Both A1 segments are present. Patent anterior communicating artery (a-comm). --Middle cerebral arteries (MCA): Normal. ANATOMIC VARIANTS: None Review of the MIP images confirms the above findings. IMPRESSION: Normal CTA of the head and neck. Electronically Signed   By: Ulyses Jarred M.D.   On: 07/18/2020 23:09   MR ANGIO HEAD WO CONTRAST  Result Date: 07/18/2020 CLINICAL DATA:  Headache and papilledema EXAM: MRI HEAD WITHOUT AND WITH CONTRAST MRA HEAD WITHOUT CONTRAST TECHNIQUE: Multiplanar, multiecho pulse sequences of the brain and surrounding structures were obtained without and with intravenous contrast. Angiographic images of the head were obtained using MRA technique without contrast. CONTRAST:  27m GADAVIST GADOBUTROL 1 MMOL/ML IV SOLN COMPARISON:  None. FINDINGS: MRI HEAD FINDINGS Severe susceptibility effects from braces markedly limit the examination. Within that limitation, the midline structures are normal. The CSF spaces are normal. No midline shift or other mass effect. Normal brain volume. No abnormal contrast enhancement. MRA HEAD FINDINGS Nondiagnostic MRA due to susceptibility effects from braces. The distal MCAs are visible and normal. The basilar artery is normal. IMPRESSION: 1. Nondiagnostic diffusion-weighted imaging due to magnetic susceptibility artifacts from braces, limiting assessment for acute ischemia. 2. Otherwise, normal brain MRI. 3. Nondiagnostic MRA due to susceptibility effects from braces. 4. CTA or CTV would provide better characterization of the vessels if needed. Electronically Signed   By: KUlyses JarredM.D.   On: 07/18/2020 22:25   MR Brain W and Wo Contrast  Result Date: 07/18/2020 CLINICAL DATA:  Headache and papilledema EXAM: MRI HEAD WITHOUT AND WITH CONTRAST MRA HEAD WITHOUT CONTRAST TECHNIQUE: Multiplanar, multiecho pulse sequences of the brain and surrounding structures were obtained without and with intravenous contrast. Angiographic images of the head were obtained using MRA technique without contrast. CONTRAST:  172mGADAVIST GADOBUTROL 1 MMOL/ML IV SOLN COMPARISON:  None. FINDINGS: MRI HEAD FINDINGS Severe susceptibility effects from braces markedly limit the examination. Within that limitation, the midline structures are normal. The CSF spaces are normal. No midline shift or other mass effect. Normal brain volume. No abnormal contrast enhancement. MRA HEAD FINDINGS Nondiagnostic MRA due to susceptibility effects from braces. The distal MCAs are visible and normal. The basilar artery is normal. IMPRESSION: 1. Nondiagnostic diffusion-weighted imaging due to magnetic susceptibility artifacts from braces, limiting assessment for acute ischemia. 2. Otherwise, normal brain MRI. 3. Nondiagnostic MRA due to susceptibility effects from braces. 4. CTA or CTV would provide better characterization of the vessels if needed. Electronically Signed   By: KeUlyses Jarred.D.   On: 07/18/2020 22:25   CT VENOGRAM HEAD  Result Date: 07/18/2020 CLINICAL DATA:  Headache with left hand numbness EXAM: CT VENOGRAM HEAD TECHNIQUE: Venographic images of the head were obtained following the administration of IV contrast material. Multi planer reformats and maximum intensity projections were generated. CONTRAST:  10052mMNIPAQUE IOHEXOL 350 MG/ML SOLN COMPARISON:  None. FINDINGS: Superior sagittal sinus: Normal. Straight sinus: Normal. Inferior sagittal sinus, vein of Galen and internal cerebral veins: Normal. Transverse sinuses: Normal. Sigmoid sinuses: Normal. Visualized jugular veins: Normal. IMPRESSION: Normal CT  venogram. Electronically Signed   By: KevUlyses JarredD.   On: 07/18/2020 23:12  Procedures Procedures   Medications Ordered in ED Medications  LORazepam (ATIVAN) injection 1 mg (has no administration in time range)  potassium chloride 10 mEq in 100 mL IVPB (10 mEq Intravenous New Bag/Given 07/18/20 2320)  metoCLOPramide (REGLAN) injection 10 mg (10 mg Intravenous Given 07/18/20 2042)  diphenhydrAMINE (BENADRYL) injection 25 mg (25 mg Intravenous Given 07/18/20 2042)  potassium chloride SA (KLOR-CON) CR tablet 40 mEq (40 mEq Oral Given 07/18/20 2042)  gadobutrol (GADAVIST) 1 MMOL/ML injection 10 mL (10 mLs Intravenous Contrast Given 07/18/20 2150)  iohexol (OMNIPAQUE) 350 MG/ML injection 100 mL (100 mLs Intravenous Contrast Given 07/18/20 2244)    ED Course  I have reviewed the triage vital signs and the nursing notes.  Pertinent labs & imaging results that were available during my care of the patient were reviewed by me and considered in my medical decision making (see chart for details).    MDM Rules/Calculators/A&P                          Consult: Reviewed with Dr. Cheral Marker.  Suggest proceeding with CT angiogram and CT venogram MRI has too much artifact from patient's braces.  He has been able to examine the MRIs and there are no acute findings to suggest acute stroke or MS.  If all studies clear, most likely complex migraine.  We will plan to follow-up with Dr. Krista Blue on outpatient basis.  Presentation is not suggestive of acute issues with pseudotumor cerebri.  Check: Patient reports that her headache which she would have rated when she came to the emergency department as "1000", is now at 6.   Patient presented as outlined above.  Extensive evaluation by CT and MRI does not show acute findings.  Patient does have migraine history.  At this time, I most suspect complex migraine.  She is clinically well in appearance.  Nontoxic.  Review of EMR suggest there was consideration for  pseudotumor.  At this time however no symptoms are suggestive this.  Patient is not encephalopathic.  No significant visual changes.  Patient is mild to moderately hypokalemic at 3.0.  Potassium replaced.  Will discharge on short course of potassium with recommended close monitoring.  Recommendation will be to continue her outpatient migraine therapy and follow-up with Dr. Krista Blue, her neurologist ASAP. Final Clinical Impression(s) / ED Diagnoses Final diagnoses:  Hemiplegic migraine without status migrainosus, not intractable  Hypokalemia    Rx / DC Orders ED Discharge Orders    None       Charlesetta Shanks, MD 07/18/20 2334

## 2020-07-19 ENCOUNTER — Emergency Department (HOSPITAL_COMMUNITY)
Admission: EM | Admit: 2020-07-19 | Discharge: 2020-07-19 | Disposition: A | Discharge status: Home / Self Care | Payer: Medicaid Other | Source: Home / Self Care | Attending: Emergency Medicine | Admitting: Emergency Medicine

## 2020-07-19 ENCOUNTER — Other Ambulatory Visit: Payer: Self-pay

## 2020-07-19 ENCOUNTER — Encounter (HOSPITAL_COMMUNITY): Payer: Self-pay

## 2020-07-19 DIAGNOSIS — E119 Type 2 diabetes mellitus without complications: Secondary | ICD-10-CM | POA: Insufficient documentation

## 2020-07-19 DIAGNOSIS — R519 Headache, unspecified: Secondary | ICD-10-CM | POA: Insufficient documentation

## 2020-07-19 DIAGNOSIS — F17210 Nicotine dependence, cigarettes, uncomplicated: Secondary | ICD-10-CM | POA: Insufficient documentation

## 2020-07-19 DIAGNOSIS — A599 Trichomoniasis, unspecified: Secondary | ICD-10-CM | POA: Insufficient documentation

## 2020-07-19 DIAGNOSIS — R0602 Shortness of breath: Secondary | ICD-10-CM | POA: Insufficient documentation

## 2020-07-19 DIAGNOSIS — R531 Weakness: Secondary | ICD-10-CM | POA: Insufficient documentation

## 2020-07-19 DIAGNOSIS — R55 Syncope and collapse: Secondary | ICD-10-CM | POA: Insufficient documentation

## 2020-07-19 DIAGNOSIS — Z7984 Long term (current) use of oral hypoglycemic drugs: Secondary | ICD-10-CM | POA: Insufficient documentation

## 2020-07-19 MED ORDER — POTASSIUM CHLORIDE CRYS ER 20 MEQ PO TBCR: 40.0000 meq | EXTENDED_RELEASE_TABLET | Freq: Every day | ORAL | 0 refills | Status: DC

## 2020-07-19 MED ORDER — METRONIDAZOLE 500 MG PO TABS
2000.0000 mg | ORAL_TABLET | Freq: Once | ORAL | Status: AC
  Administered 2020-07-19: 2000 mg via ORAL
  Filled 2020-07-19: qty 4

## 2020-07-19 NOTE — ED Triage Notes (Signed)
Pt to ED by [facility]. Pt called 911 stating she was having a stroke. Pt was just D/C'd from this hospital and worked up for a stroke and was told she was not having a stroke. Arrives c/o left foot and arm weakness. Pt is very vocal upon arrival, no neuro deficits noted. Pt states she was diagnosed her with a "light stroke". Arrives A+O, vss, NADN.

## 2020-07-19 NOTE — Discharge Instructions (Addendum)
Your work up from your previous visit was extensive and your diagnosis is "Complex Migraine", as we talked about. You are safe to be at home and follow up with your neurologist is recommended.   The symptoms that you experienced prompting your second visit are likely related to both the complex migraine and the medications you received as treatment.   After review of your urine test done earlier, there is evidence of a trichomonas infection, which you have received treatment for here.   Get plenty of rest over the next 1-2 days.

## 2020-07-19 NOTE — ED Provider Notes (Signed)
[facility] DEPT Provider Note   CSN: [id number] Arrival date & time: 07/19/20  0458     History Chief Complaint  Patient presents with  . Near Syncope    Savannah Parker is a 20 y.o. female.  Patient to ED for evaluation of near syncopal episode occurring just prior to calling EMS for transportation to ED. She was a patient in the ED last evening for evaluation of severe headache and left sided weakness. She underwent an extensive evaluation and was diagnosed with a complicated migraine headache. Her pain improved and she was discharged. On the way home, they stopped at a store. She got out of the car to walk around, was up for about 8 minutes before developing significant lightheadedness/dizziness, found it difficult to walk and was carried to the car to sit down. She describes blurred vision, dizziness, shortness of breath/hard to breathe. She states her husband blew air into her mouth which caused her to breathe normally. On arrival to the ED, she is feeling better, reporting the episode lasted about 15 minutes and has not recurred.   The history is provided by the patient. No language interpreter was used.  Near Syncope Associated symptoms include shortness of breath.       Past Medical History:  Diagnosis Date  . Anxiety    Phreesia 04/19/2020  . Back pain   . Depression   . Diabetes mellitus without complication ([place])   . Papilledema     Patient Active Problem List   Diagnosis Date Noted  . Vitamin D deficiency 07/08/2020  . Chronic migraine w/o aura w/o status migrainosus, not intractable 07/04/2020  . Pseudotumor cerebri 07/04/2020  . Encounter to establish care 04/22/2020  . Screening due 04/22/2020  . Type II diabetes mellitus, uncontrolled ([place]) 04/22/2020  . Morbid obesity ([place]) 04/22/2020  . Anxiety and depression 04/22/2020    Past Surgical History:  Procedure Laterality Date  . WISDOM TOOTH EXTRACTION Bilateral    2 removed      OB History   No obstetric history on file.     Family History  Problem Relation Age of Onset  . Heart failure Mother   . Hypertension Mother   . Heart disease Mother   . Kidney disease Mother   . Aortic aneurysm Mother   . Sickle cell anemia Father     Social History   Tobacco Use  . Smoking status: Current Every Day Smoker    Packs/day: 0.25    Types: Cigarettes  . Smokeless tobacco: Never Used  Vaping Use  . Vaping Use: Never used  Substance Use Topics  . Alcohol use: Yes    Comment: rarely  . Drug use: No    Home Medications Prior to Admission medications   Medication Sig Start Date End Date Taking? Authorizing Provider  ACCU-CHEK GUIDE test strip ACCU-CHECK GUIDE TEST STRIPS. USE DAILY AS INSTRUCTED 05/21/20   Noreene Larsson, NP  Accu-Chek Softclix Lancets lancets Use as instructed 05/20/20   Noreene Larsson, NP  acetaminophen (TYLENOL) 500 MG tablet Take 1,000 mg by mouth every 6 (six) hours as needed for moderate pain.    [provider]  albuterol (VENTOLIN HFA) 108 (90 Base) MCG/ACT inhaler Inhale into the lungs every 6 (six) hours as needed for wheezing or shortness of breath.    [provider]  blood glucose meter kit and supplies Dispense based on patient and insurance preference. Use daily to monitor blood sugar. (E11.65) 05/21/20  Noreene Larsson, NP  dicyclomine (BENTYL) 20 MG tablet Take 1 tablet (20 mg total) by mouth 2 (two) times daily. 06/19/20   Henderly, Britni A, PA-C  metFORMIN (GLUCOPHAGE) 500 MG tablet Take 1 tablet (500 mg total) by mouth 2 (two) times daily with a meal. 04/22/20   Noreene Larsson, NP  ondansetron (ZOFRAN ODT) 4 MG disintegrating tablet Take 1 tablet (4 mg total) by mouth every 8 (eight) hours as needed for nausea or vomiting. 06/19/20   Henderly, Britni A, PA-C  ondansetron (ZOFRAN ODT) 4 MG disintegrating tablet Take 1 tablet (4 mg total) by mouth every 8 (eight) hours as needed. 07/04/20   Marcial Pacas, MD   potassium chloride SA (KLOR-CON) 20 MEQ tablet Take 2 tablets (40 mEq total) by mouth daily. 07/19/20   Charlesetta Shanks, MD  rizatriptan (MAXALT-MLT) 10 MG disintegrating tablet Take 1 tablet (10 mg total) by mouth as needed. May repeat in 2 hours if needed 07/04/20   Marcial Pacas, MD  sertraline (ZOLOFT) 50 MG tablet TAKE 1 TABLET BY MOUTH EVERY DAY Patient taking differently: Take 50 mg by mouth daily. 05/14/20   Noreene Larsson, NP  topiramate (TOPAMAX) 100 MG tablet Take 1 tablet (100 mg total) by mouth 2 (two) times daily. 07/04/20   Marcial Pacas, MD  traMADol Veatrice Bourbon) 50 MG tablet Take 1 every 6-8 hours for pain not helped by Tylenol or Motrin 07/10/17   Milton Ferguson, MD  UNABLE TO FIND Accu-check guide monitor system 05/20/20   Noreene Larsson, NP  Vitamin D, Ergocalciferol, (DRISDOL) 1.25 MG (50000 UNIT) CAPS capsule Take 1 capsule (50,000 Units total) by mouth every 7 (seven) days. 07/08/20   Marcial Pacas, MD    Allergies    Onion and Other  Review of Systems   Review of Systems  Constitutional: Negative for chills and fever.  HENT: Negative.   Eyes: Positive for visual disturbance.  Respiratory: Positive for shortness of breath.   Cardiovascular: Positive for near-syncope.  Gastrointestinal: Negative.   Musculoskeletal: Negative.   Skin: Negative.   Neurological: Positive for dizziness and light-headedness. Negative for syncope.    Physical Exam Updated Vital Signs BP 116/71   Pulse 79   Temp 98.5 F (36.9 C) (Oral)   Resp 18   SpO2 98%   Physical Exam Vitals and nursing note reviewed.  Constitutional:      Appearance: She is well-developed.  HENT:     Head: Normocephalic.  Cardiovascular:     Rate and Rhythm: Normal rate and regular rhythm.  Pulmonary:     Effort: Pulmonary effort is normal.     Breath sounds: Normal breath sounds.  Abdominal:     General: Bowel sounds are normal.     Palpations: Abdomen is soft.     Tenderness: There is no abdominal tenderness. There  is no guarding or rebound.  Musculoskeletal:        General: Normal range of motion.     Cervical back: Normal range of motion and neck supple.  Skin:    General: Skin is warm and dry.     Findings: No rash.  Neurological:     General: No focal deficit present.     Mental Status: She is alert and oriented to person, place, and time.     Comments: Left grip and left LE weakness. Normal reflexes. Alert, oriented. PERRL. No CN deficits of 3-12. Speech clear and focused.  No facial asymmetry.      ED  Results / Procedures / Treatments   Labs (all labs ordered are listed, but only abnormal results are displayed) Labs Reviewed - No data to display  EKG None  Radiology CT Angio Head W or Wo Contrast  Result Date: 07/18/2020 CLINICAL DATA:  Dizziness with left hand numbness EXAM: CT ANGIOGRAPHY HEAD AND NECK TECHNIQUE: Multidetector CT imaging of the head and neck was performed using the standard protocol during bolus administration of intravenous contrast. Multiplanar CT image reconstructions and MIPs were obtained to evaluate the vascular anatomy. Carotid stenosis measurements (when applicable) are obtained utilizing NASCET criteria, using the distal internal carotid diameter as the denominator. CONTRAST:  15m OMNIPAQUE IOHEXOL 350 MG/ML SOLN COMPARISON:  None. FINDINGS: CTA NECK FINDINGS SKELETON: There is no bony spinal canal stenosis. No lytic or blastic lesion. OTHER NECK: Normal pharynx, larynx and major salivary glands. No cervical lymphadenopathy. Unremarkable thyroid gland. UPPER CHEST: No pneumothorax or pleural effusion. No nodules or masses. AORTIC ARCH: There is no calcific atherosclerosis of the aortic arch. There is no aneurysm, dissection or hemodynamically significant stenosis of the visualized portion of the aorta. Conventional 3 vessel aortic branching pattern. The visualized proximal subclavian arteries are widely patent. RIGHT CAROTID SYSTEM: Normal without aneurysm,  dissection or stenosis. LEFT CAROTID SYSTEM: Normal without aneurysm, dissection or stenosis. VERTEBRAL ARTERIES: Left dominant configuration. Both origins are clearly patent. There is no dissection, occlusion or flow-limiting stenosis to the skull base (V1-V3 segments). CTA HEAD FINDINGS POSTERIOR CIRCULATION: --Vertebral arteries: Normal V4 segments. --Inferior cerebellar arteries: Normal. --Basilar artery: Normal. --Superior cerebellar arteries: Normal. --Posterior cerebral arteries (PCA): Normal. ANTERIOR CIRCULATION: --Intracranial internal carotid arteries: Normal. --Anterior cerebral arteries (ACA): Normal. Both A1 segments are present. Patent anterior communicating artery (a-comm). --Middle cerebral arteries (MCA): Normal. ANATOMIC VARIANTS: None Review of the MIP images confirms the above findings. IMPRESSION: Normal CTA of the head and neck. Electronically Signed   By: KUlyses JarredM.D.   On: 07/18/2020 23:09   CT Head Wo Contrast  Result Date: 07/18/2020 CLINICAL DATA:  Dizziness EXAM: CT HEAD WITHOUT CONTRAST TECHNIQUE: Contiguous axial images were obtained from the base of the skull through the vertex without intravenous contrast. COMPARISON:  CT report 12/22/2017 FINDINGS: Brain: No evidence of acute infarction, hemorrhage, hydrocephalus, extra-axial collection or mass lesion/mass effect. Vascular: No hyperdense vessel or unexpected calcification. Skull: Normal. Negative for fracture or focal lesion. Sinuses/Orbits: No acute finding. Other: None IMPRESSION: Negative non contrasted CT appearance of the brain. Electronically Signed   By: KDonavan FoilM.D.   On: 07/18/2020 19:23   CT Angio Neck W and/or Wo Contrast  Result Date: 07/18/2020 CLINICAL DATA:  Dizziness with left hand numbness EXAM: CT ANGIOGRAPHY HEAD AND NECK TECHNIQUE: Multidetector CT imaging of the head and neck was performed using the standard protocol during bolus administration of intravenous contrast. Multiplanar CT image  reconstructions and MIPs were obtained to evaluate the vascular anatomy. Carotid stenosis measurements (when applicable) are obtained utilizing NASCET criteria, using the distal internal carotid diameter as the denominator. CONTRAST:  1013mOMNIPAQUE IOHEXOL 350 MG/ML SOLN COMPARISON:  None. FINDINGS: CTA NECK FINDINGS SKELETON: There is no bony spinal canal stenosis. No lytic or blastic lesion. OTHER NECK: Normal pharynx, larynx and major salivary glands. No cervical lymphadenopathy. Unremarkable thyroid gland. UPPER CHEST: No pneumothorax or pleural effusion. No nodules or masses. AORTIC ARCH: There is no calcific atherosclerosis of the aortic arch. There is no aneurysm, dissection or hemodynamically significant stenosis of the visualized portion of the aorta. Conventional 3 vessel  aortic branching pattern. The visualized proximal subclavian arteries are widely patent. RIGHT CAROTID SYSTEM: Normal without aneurysm, dissection or stenosis. LEFT CAROTID SYSTEM: Normal without aneurysm, dissection or stenosis. VERTEBRAL ARTERIES: Left dominant configuration. Both origins are clearly patent. There is no dissection, occlusion or flow-limiting stenosis to the skull base (V1-V3 segments). CTA HEAD FINDINGS POSTERIOR CIRCULATION: --Vertebral arteries: Normal V4 segments. --Inferior cerebellar arteries: Normal. --Basilar artery: Normal. --Superior cerebellar arteries: Normal. --Posterior cerebral arteries (PCA): Normal. ANTERIOR CIRCULATION: --Intracranial internal carotid arteries: Normal. --Anterior cerebral arteries (ACA): Normal. Both A1 segments are present. Patent anterior communicating artery (a-comm). --Middle cerebral arteries (MCA): Normal. ANATOMIC VARIANTS: None Review of the MIP images confirms the above findings. IMPRESSION: Normal CTA of the head and neck. Electronically Signed   By: Ulyses Jarred M.D.   On: 07/18/2020 23:09   MR ANGIO HEAD WO CONTRAST  Result Date: 07/18/2020 CLINICAL DATA:  Headache  and papilledema EXAM: MRI HEAD WITHOUT AND WITH CONTRAST MRA HEAD WITHOUT CONTRAST TECHNIQUE: Multiplanar, multiecho pulse sequences of the brain and surrounding structures were obtained without and with intravenous contrast. Angiographic images of the head were obtained using MRA technique without contrast. CONTRAST:  3m GADAVIST GADOBUTROL 1 MMOL/ML IV SOLN COMPARISON:  None. FINDINGS: MRI HEAD FINDINGS Severe susceptibility effects from braces markedly limit the examination. Within that limitation, the midline structures are normal. The CSF spaces are normal. No midline shift or other mass effect. Normal brain volume. No abnormal contrast enhancement. MRA HEAD FINDINGS Nondiagnostic MRA due to susceptibility effects from braces. The distal MCAs are visible and normal. The basilar artery is normal. IMPRESSION: 1. Nondiagnostic diffusion-weighted imaging due to magnetic susceptibility artifacts from braces, limiting assessment for acute ischemia. 2. Otherwise, normal brain MRI. 3. Nondiagnostic MRA due to susceptibility effects from braces. 4. CTA or CTV would provide better characterization of the vessels if needed. Electronically Signed   By: KUlyses JarredM.D.   On: 07/18/2020 22:25   MR Brain W and Wo Contrast  Result Date: 07/18/2020 CLINICAL DATA:  Headache and papilledema EXAM: MRI HEAD WITHOUT AND WITH CONTRAST MRA HEAD WITHOUT CONTRAST TECHNIQUE: Multiplanar, multiecho pulse sequences of the brain and surrounding structures were obtained without and with intravenous contrast. Angiographic images of the head were obtained using MRA technique without contrast. CONTRAST:  145mGADAVIST GADOBUTROL 1 MMOL/ML IV SOLN COMPARISON:  None. FINDINGS: MRI HEAD FINDINGS Severe susceptibility effects from braces markedly limit the examination. Within that limitation, the midline structures are normal. The CSF spaces are normal. No midline shift or other mass effect. Normal brain volume. No abnormal contrast  enhancement. MRA HEAD FINDINGS Nondiagnostic MRA due to susceptibility effects from braces. The distal MCAs are visible and normal. The basilar artery is normal. IMPRESSION: 1. Nondiagnostic diffusion-weighted imaging due to magnetic susceptibility artifacts from braces, limiting assessment for acute ischemia. 2. Otherwise, normal brain MRI. 3. Nondiagnostic MRA due to susceptibility effects from braces. 4. CTA or CTV would provide better characterization of the vessels if needed. Electronically Signed   By: KeUlyses Jarred.D.   On: 07/18/2020 22:25   CT VENOGRAM HEAD  Result Date: 07/18/2020 CLINICAL DATA:  Headache with left hand numbness EXAM: CT VENOGRAM HEAD TECHNIQUE: Venographic images of the head were obtained following the administration of IV contrast material. Multi planer reformats and maximum intensity projections were generated. CONTRAST:  10031mMNIPAQUE IOHEXOL 350 MG/ML SOLN COMPARISON:  None. FINDINGS: Superior sagittal sinus: Normal. Straight sinus: Normal. Inferior sagittal sinus, vein of Galen and internal cerebral veins: Normal.  Transverse sinuses: Normal. Sigmoid sinuses: Normal. Visualized jugular veins: Normal. IMPRESSION: Normal CT venogram. Electronically Signed   By: Ulyses Jarred M.D.   On: 07/18/2020 23:12    Procedures Procedures   Medications Ordered in ED Medications  metroNIDAZOLE (FLAGYL) tablet 2,000 mg (has no administration in time range)    ED Course  I have reviewed the triage vital signs and the nursing notes.  Pertinent labs & imaging results that were available during my care of the patient were reviewed by me and considered in my medical decision making (see chart for details).    MDM Rules/Calculators/A&P                          Patient to ED for ss/sxs as detailed in the HPI.   Chart reviewed. She was diagnosed with complex migraine on earlier visit after studies that included CT w/o CM; CTA head, neck; CT venogram; MRI brain; MR angio head.  Her work up included a phone conversation with neuro where imaging was reviewed. She was discharged appropriately with f/u recommended with her neurologist.   The patient is currently asymptomatic. Details of her work up and diagnosis were discussed in detail with her and her husband, and all questions were answered. She can be discharged home and patient/spouse are comfortable with plan of discharge without further testing.   Also on chart review, the UA was found to be abnormal with >50 WBC, rare bacteria and trichomonas. She does not have any urinary symptoms. No fever. Urine culture was sent and is pending. The diagnosis of trichomonas was discussed and she is given 2 gm Flagyl dose in the ED as full treatment.    Final Clinical Impression(s) / ED Diagnoses Final diagnoses:  None   1. Near syncope 2. Trichomonas  Rx / DC Orders ED Discharge Orders    None       Dennie Bible 07/19/20 2440    Palumbo, April, MD 07/19/20 [phone]

## 2020-07-29 NOTE — Telephone Encounter (Signed)
Patient had a MRI Brain w/wo contrast at [facility] on 07/18/20. Does she still need to have the MRI Brain without contrast at [facility] tomorrow?

## 2020-07-29 NOTE — Telephone Encounter (Signed)
IMPRESSION: 1. Nondiagnostic diffusion-weighted imaging due to magnetic susceptibility artifacts from braces, limiting assessment for acute ischemia. 2. Otherwise, normal brain MRI. 3. Nondiagnostic MRA due to susceptibility effects from braces. 4. CTA or CTV would provide better characterization of the vessels if needed.  Normal CT angiogram of head and neck  Normal CT venogram  Please cancel previous ordered MRI of the brain, placed order for fluoroscopy guided LP,

## 2020-07-29 NOTE — Addendum Note (Signed)
Addended by: Levert Feinstein on: 07/29/2020 05:50 PM   Modules accepted: Orders

## 2020-07-30 ENCOUNTER — Telehealth: Payer: Self-pay | Admitting: Neurology

## 2020-07-30 ENCOUNTER — Other Ambulatory Visit: Payer: Medicaid Other

## 2020-07-30 NOTE — Telephone Encounter (Signed)
LP is scheduled for 07/31/20 at [facility].

## 2020-07-30 NOTE — Telephone Encounter (Signed)
Noted, faxed LP order to [facility] at [facility]. Fax # [phone] & ph # [phone].

## 2020-07-30 NOTE — Telephone Encounter (Signed)
Savannah Parker with [employer] me asking to Please indicate what labs are needed for Lumbar Puncture please.

## 2020-07-30 NOTE — Telephone Encounter (Signed)
Pt is asking for a call from either Dr Terrace Arabia or Charmaine Downs re: why she needs the spinal tap on tomorrow, please call

## 2020-07-30 NOTE — Telephone Encounter (Signed)
She understands the rationale behind the ordered LP. She had no further questions and is agreeable to move forward with the procedure.

## 2020-07-31 ENCOUNTER — Ambulatory Visit
Admission: RE | Admit: 2020-07-31 | Discharge: 2020-07-31 | Disposition: A | Discharge status: Home / Self Care | Payer: Medicaid Other | Source: Ambulatory Visit | Attending: Neurology | Admitting: Neurology

## 2020-07-31 ENCOUNTER — Other Ambulatory Visit: Payer: Self-pay

## 2020-07-31 ENCOUNTER — Telehealth: Payer: Self-pay | Admitting: Neurology

## 2020-07-31 VITALS — BP 159/99 | HR 96

## 2020-07-31 DIAGNOSIS — G932 Benign intracranial hypertension: Secondary | ICD-10-CM

## 2020-07-31 NOTE — Telephone Encounter (Signed)
I spoke to the patient and provided her with the results. °

## 2020-07-31 NOTE — Discharge Instructions (Signed)
Lumbar Puncture Discharge Instructions  1. Go home and rest quietly as needed. You may resume normal activities; however, do not exert yourself strongly or do any heavy lifting today and tomorrow.   2. DO NOT drive today.    3. You may resume your normal diet and medications unless otherwise indicated. Drink lots of extra fluids today and tomorrow.   4. The incidence of headache, nausea, or vomiting is about 5% (one in 20 patients).  If you develop a headache, lie flat for 24 hours and drink plenty of fluids until the headache goes away.  Caffeinated beverages may be helpful. If when you get up you still have a headache when standing, go back to bed and force fluids for another 24 hours.   5. If you develop severe nausea and vomiting or a headache that does not go away with the flat bedrest after 48 hours, please call [phone].   6. Call your physician for a follow-up appointment.  The results of your Lumbar Puncture will be sent directly to your physician and they will contact you.   7. If you have any questions or if complications develop after you arrive home, please call [phone].  Discharge instructions have been explained to the patient.  The patient, or the person responsible for the patient, fully understands these instructions.   Thank you for visiting our office today.  

## 2020-07-31 NOTE — Telephone Encounter (Signed)
Please call patient, lumbar puncture showed normal opening pressure, normal spinal fluid testing

## 2020-08-10 ENCOUNTER — Other Ambulatory Visit: Payer: Self-pay

## 2020-08-10 ENCOUNTER — Emergency Department (HOSPITAL_COMMUNITY)
Admission: EM | Admit: 2020-08-10 | Discharge: 2020-08-10 | Disposition: A | Discharge status: Home / Self Care | Payer: Medicaid Other | Attending: Emergency Medicine | Admitting: Emergency Medicine

## 2020-08-10 DIAGNOSIS — Z5321 Procedure and treatment not carried out due to patient leaving prior to being seen by health care provider: Secondary | ICD-10-CM | POA: Insufficient documentation

## 2020-08-10 DIAGNOSIS — R519 Headache, unspecified: Secondary | ICD-10-CM | POA: Diagnosis present

## 2020-08-10 DIAGNOSIS — I10 Essential (primary) hypertension: Secondary | ICD-10-CM | POA: Diagnosis not present

## 2020-08-10 NOTE — ED Triage Notes (Addendum)
Pt c/o hypertension today and a headache. Pt states she has a hx of headaches and has already taken her medication prior to arrival.

## 2020-08-29 LAB — CSF CELL COUNT WITH DIFFERENTIAL
RBC Count, CSF: 0 cells/uL
WBC, CSF: 1 cells/uL (ref 0–5)

## 2020-08-29 LAB — VDRL, CSF: VDRL Quant, CSF: NONREACTIVE

## 2020-08-29 LAB — FUNGUS CULTURE W SMEAR
CULTURE:: NO GROWTH
MICRO NUMBER:: 11849040
SMEAR:: NONE SEEN
SPECIMEN QUALITY:: ADEQUATE

## 2020-08-29 LAB — PROTEIN, CSF: Total Protein, CSF: 18 mg/dL (ref 15–45)

## 2020-08-29 LAB — GRAM STAIN
MICRO NUMBER:: 11849039
SPECIMEN QUALITY:: ADEQUATE

## 2020-08-29 LAB — GLUCOSE, CSF: Glucose, CSF: 48 mg/dL (ref 40–80)

## 2020-09-03 ENCOUNTER — Telehealth: Payer: Self-pay | Admitting: *Deleted

## 2020-09-03 NOTE — Telephone Encounter (Signed)
Dr. Anne Hahn reviewed LP results. There were no issues with her spinal fluid testing. Opening pressure normal at 13cm H20. The patient should continue her topiramate 100mg  BID for migraine management and keep her pending follow up on 09/12/20.  I called and left a detailed message with this information.

## 2020-09-04 ENCOUNTER — Emergency Department (HOSPITAL_COMMUNITY): Payer: Medicaid Other

## 2020-09-04 ENCOUNTER — Emergency Department (HOSPITAL_COMMUNITY)
Admission: EM | Admit: 2020-09-04 | Discharge: 2020-09-04 | Disposition: A | Discharge status: Home / Self Care | Payer: Medicaid Other | Attending: Emergency Medicine | Admitting: Emergency Medicine

## 2020-09-04 ENCOUNTER — Encounter (HOSPITAL_COMMUNITY): Payer: Self-pay

## 2020-09-04 DIAGNOSIS — R202 Paresthesia of skin: Secondary | ICD-10-CM | POA: Diagnosis not present

## 2020-09-04 DIAGNOSIS — R0789 Other chest pain: Secondary | ICD-10-CM | POA: Insufficient documentation

## 2020-09-04 DIAGNOSIS — Z7984 Long term (current) use of oral hypoglycemic drugs: Secondary | ICD-10-CM | POA: Diagnosis not present

## 2020-09-04 DIAGNOSIS — R531 Weakness: Secondary | ICD-10-CM | POA: Diagnosis not present

## 2020-09-04 DIAGNOSIS — E119 Type 2 diabetes mellitus without complications: Secondary | ICD-10-CM | POA: Diagnosis not present

## 2020-09-04 DIAGNOSIS — F17210 Nicotine dependence, cigarettes, uncomplicated: Secondary | ICD-10-CM | POA: Insufficient documentation

## 2020-09-04 DIAGNOSIS — R0602 Shortness of breath: Secondary | ICD-10-CM | POA: Insufficient documentation

## 2020-09-04 LAB — CBC
HCT: 40.7 % (ref 36.0–46.0)
Hemoglobin: 13 g/dL (ref 12.0–15.0)
MCH: 28.6 pg (ref 26.0–34.0)
MCHC: 31.9 g/dL (ref 30.0–36.0)
MCV: 89.5 fL (ref 80.0–100.0)
Platelets: 378 10*3/uL (ref 150–400)
RBC: 4.55 MIL/uL (ref 3.87–5.11)
RDW: 13.6 % (ref 11.5–15.5)
WBC: 6.3 10*3/uL (ref 4.0–10.5)
nRBC: 0 % (ref 0.0–0.2)

## 2020-09-04 LAB — BASIC METABOLIC PANEL
Anion gap: 5 (ref 5–15)
BUN: 8 mg/dL (ref 6–20)
CO2: 26 mmol/L (ref 22–32)
Calcium: 8.9 mg/dL (ref 8.9–10.3)
Chloride: 107 mmol/L (ref 98–111)
Creatinine, Ser: 0.67 mg/dL (ref 0.44–1.00)
GFR, Estimated: >60 mL/min (ref >60)
Glucose, Bld: 121 mg/dL — ABNORMAL HIGH (ref 70–99)
Potassium: 3.6 mmol/L (ref 3.5–5.1)
Sodium: 138 mmol/L (ref 135–145)

## 2020-09-04 LAB — TROPONIN I (HIGH SENSITIVITY): Troponin I (High Sensitivity): <2 ng/L (ref <18)

## 2020-09-04 LAB — I-STAT BETA HCG BLOOD, ED (MC, WL, AP ONLY): I-stat hCG, quantitative: <5 m[IU]/mL (ref <5)

## 2020-09-04 MED ORDER — PANTOPRAZOLE SODIUM 20 MG PO TBEC: 20.0000 mg | DELAYED_RELEASE_TABLET | Freq: Every day | ORAL | 0 refills | Status: AC

## 2020-09-04 NOTE — Discharge Instructions (Addendum)
You were seen in the ER for your chest pain.  Your physical exam, vital signs, blood work, EKG, chest x-ray were very reassuring.  While the exact cause of your chest pain remains unclear, there does not appear to be any emergent problem with your heart or lungs at this time.  Suspect possible contribution by your acid reflux; you have been prescribed a medication called Protonix to take daily for the next month to help with your reflux.  Please continue to follow-up with your neurologist and primary care provider as previously scheduled for management of your other symptoms.  Return to ER if you develop any worsening chest pain, difficulty breathing, nausea or vomiting does not stop, or any other new severe symptoms.

## 2020-09-04 NOTE — ED Triage Notes (Addendum)
Patient arrived via POV with husband.   Patient reports she was here over a month ago and told she had a stroke. This Nurse has checked chart and unsure pf history of stroke. Please read previous charting notes.   C/o left stabbing chest pain and left arm weakness/ numbness  8/10 pain   A/Ox4

## 2020-09-04 NOTE — ED Provider Notes (Signed)
[facility] DEPT Provider Note   CSN: [id number] Arrival date & time: 09/04/20  1452     History Chief Complaint  Patient presents with  . Chest Pain    Savannah Parker is a 20 y.o. female who presents with concern for central chest pain and pressure as well as left arm weakness and tingling sensation is started on 9:00 this morning.  She noticed the symptoms worsening when she woke up.  Patient with history of neurologic symptoms that have persisted since April of this year, initially diagnosed with complex migraine, which she follows with neurology for chronic left-sided weakness since that time.  Stroke work-up at that time was unremarkable. She states for that from a neurologic standpoint she has not had any worsening of her numbness, tingling, weakness in the left side today.  The only new symptom today is the central chest pain with intermittent shortness of breath.  She has no history of similar symptoms in the past.  Does have a strong family history for blood clots in the past but has no recent travel, prolonged immobilization or surgical procedure, is not on any birth control or other hormonal replacement therapy.  She states she is attempting to conceive at this time.  I have personally reviewed the patient's medical records.  She has history of type 2 diabetes, morbid obesity, anxiety and depression as well as chronic complicated migraines.  Additionally patient endorses history of acid reflux for which she does not take any medications.  HPI     Past Medical History:  Diagnosis Date  . Anxiety    Phreesia 04/19/2020  . Back pain   . Depression   . Diabetes mellitus without complication ([place])   . Papilledema     Patient Active Problem List   Diagnosis Date Noted  . Vitamin D deficiency 07/08/2020  . Chronic migraine w/o aura w/o status migrainosus, not intractable 07/04/2020  . Pseudotumor cerebri 07/04/2020  . Encounter to establish care  04/22/2020  . Screening due 04/22/2020  . Type II diabetes mellitus, uncontrolled ([place]) 04/22/2020  . Morbid obesity ([place]) 04/22/2020  . Anxiety and depression 04/22/2020    Past Surgical History:  Procedure Laterality Date  . WISDOM TOOTH EXTRACTION Bilateral    2 removed     OB History   No obstetric history on file.     Family History  Problem Relation Age of Onset  . Heart failure Mother   . Hypertension Mother   . Heart disease Mother   . Kidney disease Mother   . Aortic aneurysm Mother   . Sickle cell anemia Father     Social History   Tobacco Use  . Smoking status: Current Every Day Smoker    Packs/day: 0.25    Types: Cigarettes  . Smokeless tobacco: Never Used  Vaping Use  . Vaping Use: Never used  Substance Use Topics  . Alcohol use: Yes    Comment: rarely  . Drug use: No    Home Medications Prior to Admission medications   Medication Sig Start Date End Date Taking? Authorizing Provider  pantoprazole (PROTONIX) 20 MG tablet Take 1 tablet (20 mg total) by mouth daily. 09/04/20 10/04/20 Yes Diahann Guajardo R, PA-C  ACCU-CHEK GUIDE test strip ACCU-CHECK GUIDE TEST STRIPS. USE DAILY AS INSTRUCTED 05/21/20   Noreene Larsson, NP  Accu-Chek Softclix Lancets lancets Use as instructed 05/20/20   Noreene Larsson, NP  acetaminophen (TYLENOL) 500 MG tablet Take 1,000 mg by mouth every  6 (six) hours as needed for moderate pain.    [provider]  albuterol (VENTOLIN HFA) 108 (90 Base) MCG/ACT inhaler Inhale into the lungs every 6 (six) hours as needed for wheezing or shortness of breath.    [provider]  blood glucose meter kit and supplies Dispense based on patient and insurance preference. Use daily to monitor blood sugar. (E11.65) 05/21/20   Noreene Larsson, NP  dicyclomine (BENTYL) 20 MG tablet Take 1 tablet (20 mg total) by mouth 2 (two) times daily. 06/19/20   Henderly, Britni A, PA-C  metFORMIN (GLUCOPHAGE) 500 MG tablet Take 1 tablet (500 mg  total) by mouth 2 (two) times daily with a meal. 04/22/20   Noreene Larsson, NP  ondansetron (ZOFRAN ODT) 4 MG disintegrating tablet Take 1 tablet (4 mg total) by mouth every 8 (eight) hours as needed for nausea or vomiting. 06/19/20   Henderly, Britni A, PA-C  ondansetron (ZOFRAN ODT) 4 MG disintegrating tablet Take 1 tablet (4 mg total) by mouth every 8 (eight) hours as needed. 07/04/20   Marcial Pacas, MD  potassium chloride SA (KLOR-CON) 20 MEQ tablet Take 2 tablets (40 mEq total) by mouth daily. 07/19/20   Charlesetta Shanks, MD  rizatriptan (MAXALT-MLT) 10 MG disintegrating tablet Take 1 tablet (10 mg total) by mouth as needed. May repeat in 2 hours if needed 07/04/20   Marcial Pacas, MD  sertraline (ZOLOFT) 50 MG tablet TAKE 1 TABLET BY MOUTH EVERY DAY Patient taking differently: Take 50 mg by mouth daily. 05/14/20   Noreene Larsson, NP  topiramate (TOPAMAX) 100 MG tablet Take 1 tablet (100 mg total) by mouth 2 (two) times daily. 07/04/20   Marcial Pacas, MD  traMADol Veatrice Bourbon) 50 MG tablet Take 1 every 6-8 hours for pain not helped by Tylenol or Motrin 07/10/17   Milton Ferguson, MD  UNABLE TO FIND Accu-check guide monitor system 05/20/20   Noreene Larsson, NP  Vitamin D, Ergocalciferol, (DRISDOL) 1.25 MG (50000 UNIT) CAPS capsule Take 1 capsule (50,000 Units total) by mouth every 7 (seven) days. 07/08/20   Marcial Pacas, MD    Allergies    Onion and Other  Review of Systems   Review of Systems  Constitutional: Negative.   HENT: Negative.   Respiratory: Positive for chest tightness and shortness of breath. Negative for apnea, cough, choking, wheezing and stridor.   Cardiovascular: Positive for chest pain. Negative for palpitations and leg swelling.  Gastrointestinal: Negative.   Genitourinary: Negative.   Musculoskeletal: Negative.   Skin: Negative.   Neurological: Negative.   Hematological: Negative.     Physical Exam Updated Vital Signs BP 132/75   Pulse 94   Temp 98.7 F (37.1 C)   Resp 17   LMP  05/27/2020   SpO2 99%   Physical Exam Vitals and nursing note reviewed.  Constitutional:      General: She is not in acute distress.    Appearance: She is obese. She is not ill-appearing or toxic-appearing.  HENT:     Head: Normocephalic and atraumatic.     Nose: Nose normal.     Mouth/Throat:     Mouth: Mucous membranes are moist.     Pharynx: Oropharynx is clear. Uvula midline. No oropharyngeal exudate, posterior oropharyngeal erythema or uvula swelling.     Tonsils: No tonsillar exudate.  Eyes:     General: Lids are normal. Vision grossly intact.        Right eye: No discharge.  Left eye: No discharge.     Extraocular Movements: Extraocular movements intact.     Conjunctiva/sclera: Conjunctivae normal.     Pupils: Pupils are equal, round, and reactive to light.  Neck:     Trachea: Trachea and phonation normal.  Cardiovascular:     Rate and Rhythm: Normal rate and regular rhythm.     Pulses: Normal pulses.     Heart sounds: Normal heart sounds. No murmur heard.   Pulmonary:     Effort: Pulmonary effort is normal. No tachypnea, bradypnea, accessory muscle usage, prolonged expiration or respiratory distress.     Breath sounds: Normal breath sounds. No wheezing or rales.  Chest:     Chest wall: No mass, lacerations, deformity, swelling, tenderness, crepitus or edema.  Abdominal:     General: Bowel sounds are normal. There is no distension.     Palpations: Abdomen is soft.     Tenderness: There is no abdominal tenderness. There is no right CVA tenderness or left CVA tenderness.  Musculoskeletal:        General: No deformity.     Cervical back: Normal range of motion and neck supple.     Right lower leg: No edema.     Left lower leg: No edema.     Comments: Unilateral weakness in the left upper and lower extremity, very subtly weaker relative to the right side.  Per patient at her baseline.  Lymphadenopathy:     Cervical: No cervical adenopathy.  Skin:    General:  Skin is warm and dry.     Capillary Refill: Capillary refill takes less than 2 seconds.     Findings: No rash.  Neurological:     Mental Status: She is alert and oriented to person, place, and time. Mental status is at baseline.     GCS: GCS eye subscore is 4. GCS verbal subscore is 5. GCS motor subscore is 6.     Sensory: Sensation is intact.     Motor: Weakness present.     Gait: Gait is intact.  Psychiatric:        Mood and Affect: Mood normal.     ED Results / Procedures / Treatments   Labs (all labs ordered are listed, but only abnormal results are displayed) Labs Reviewed  BASIC METABOLIC PANEL - Abnormal; Notable for the following components:      Result Value   Glucose, Bld 121 (*)    All other components within normal limits  CBC  I-STAT BETA HCG BLOOD, ED ([facility], [facility], [facility] ONLY)  TROPONIN I (HIGH SENSITIVITY)    EKG EKG Interpretation  Date/Time:  Wednesday September 04 2020 14:59:23 EDT Ventricular Rate:  105 PR Interval:  151 QRS Duration: 69 QT Interval:  326 QTC Calculation: 431 R Axis:   41 Text Interpretation: Sinus tachycardia 12 Lead; Mason-Likar No significant change since last tracing Confirmed by Dorie Rank [phone]) on 09/04/2020 3:15:59 PM   Radiology DG Chest 2 View  Result Date: 09/04/2020 CLINICAL DATA:  chest pain, left stabbing chest pain and left arm weakness/numbness EXAM: CHEST - 2 VIEW COMPARISON:  Radiograph 06/19/2020 FINDINGS: The heart size and mediastinal contours are within normal limits. No focal airspace disease. No pleural effusion or pneumothorax. No acute osseous abnormality. IMPRESSION: No evidence of acute cardiopulmonary disease. Electronically Signed   By: Maurine Simmering   On: 09/04/2020 15:52    Procedures Procedures   Medications Ordered in ED Medications - No data to display  ED Course  I have reviewed the triage vital signs and the nursing notes.  Pertinent labs & imaging results that were available during my care of the patient  were reviewed by me and considered in my medical decision making (see chart for details).    MDM Rules/Calculators/A&P                         20 year old female who presents with concern for central chest pain that started this morning and progressively worsened throughout the day with some associated shortness of breath.  Differential diagnose includes vomited to ACS, pneumonia, PE, pleural effusion, GERD, dysrhythmia.  Hypertensive tachycardic on intake, vital signs otherwise normal.  At time of my exam, patient is more tachycardic.  Cardiopulmonary exam is normal, abdominal exam is benign.  She is vascularly intact upper extremities.  PERC negative.  Basic labs were obtained in triage.  CBC unremarkable, BMP unremarkable, patient is not pregnant, and troponin is negative, less than 2.  Chest x-ray negative for acute cardiopulmonary disease, and EKG with sinus rhythm without ischemic changes.  From a chest pain perspective, no further work-up warranted needed this time.  Suspect possible contribution patient's underlying GERD, will discharge with prescription for Protonix.  No evidence for emergent cardiopulmonary abnormality.   From her neurological standpoint, patient is at her baseline and has very well-established outpatient care.  This concern was not addressed in the emergency setting as she does not have any acute change in her symptoms, rather chronic changes persistent since April of this year. No further work-up warranted in the ED at this time.  Savannah Parker voiced understanding of her medical evaluation and treatment plan.  Each of her questions was answered to her expressed inspection.  Return precautions were given.  Patient is well-appearing, stable, and appropriate discharge at this time.  This chart was dictated using voice recognition software, Dragon. Despite the best efforts of this provider to proofread and correct errors, errors may still occur which can change documentation  meaning.  Final Clinical Impression(s) / ED Diagnoses Final diagnoses:  Other chest pain    Rx / DC Orders ED Discharge Orders         Ordered    pantoprazole (PROTONIX) 20 MG tablet  Daily        09/04/20 [address] Gypsy Balsam, PA-C 09/04/20 Lenard Lance, MD 09/05/20 [phone]

## 2020-09-04 NOTE — ED Provider Notes (Signed)
Emergency Medicine Provider Triage Evaluation Note  Savannah Parker , a 20 y.o. female  was evaluated in triage.  Pt complains of chest pain, left arm weakness and numbness since 9 AM this morning.  Patient noticed the symptoms when she first woke up.  Patient states she had a stroke last month.  She is not on any anticoagulation..  Review of Systems  Positive: Chest pain, left arm pain and weakness, Negative: Fever, chills, headache  Physical Exam  BP (!) 152/106 (BP Location: Left Arm)   Pulse (!) 110   Temp 99.2 F (37.3 C) (Oral)   Resp 18   LMP 05/27/2020   SpO2 100%  Gen:   Awake, no distress   Resp:  Normal effort  MSK:   Moves extremities without difficulty  Other:  Tachycardic to 110  Medical Decision Making  Medically screening exam initiated at 3:13 PM.  Appropriate orders placed.  Tajha Sammarco was informed that the remainder of the evaluation will be completed by another provider, this initial triage assessment does not replace that evaluation, and the importance of remaining in the ED until their evaluation is complete.  Cardiac work-up initiated.  Patient has 4 out of 5 grip strength on the left upper extremity compared to 5 out of 5 grip ring on the right.  I viewed patient's chart from 07/18/2020 when patient was seen for similar symptoms.  She had a negative stroke work-up and thought to have complex migraines recommended neuro follow-up outpatient.   Portions of this note were generated with Scientist, clinical (histocompatibility and immunogenetics). Dictation errors may occur despite best attempts at proofreading.    Shanon Ace, PA-C 09/04/20 1513    Linwood Dibbles, MD 09/05/20 [phone]

## 2020-09-12 ENCOUNTER — Ambulatory Visit: Payer: Medicaid Other | Admitting: Neurology

## 2020-09-12 ENCOUNTER — Encounter: Payer: Self-pay | Admitting: Neurology

## 2020-09-12 NOTE — Progress Notes (Deleted)
PATIENT: Savannah Parker DOB: 09-11-2000  REASON FOR VISIT: follow up HISTORY FROM: patient Primary Neurologist:  HISTORY OF PRESENT ILLNESS: Today 09/12/20  HISTORY  Savannah Parker is a 20 year old female, seen in request by her optometrist Dr. Fonnie Mu, for evaluation of bilateral papillary edema, Her primary care physician is nurse practitioner gray, Trinna Balloon, initial evaluation was on July 04, 2020.   I reviewed and summarized the referring note.   She reported a history of occasional headaches since middle school, but increased frequency since 2020, retro-orbital area, sometimes lateralized severe pounding headache with associated light, noise sensitivity, nauseous, movement made her headache worse, it can last hours or even 1 weeks,   Because of increased frequency of her headache, over the past couple years, she has been taking daily Tylenol up to 10 to 12 tablets, along with Excedrin Migraine and ibuprofen   Trigger for her migraine headaches are stress, sleep deprivation, exertion, dehydration, missing meals, weather changes, certain processing food   Last eye examination was 2019, on April 23, 2020, she was seen by optometrist Dr.Skelton, Mickel Baas, there was evidence of bilateral papillary edema,   She also has weight fluctuation, obesity, diagnosis of diabetes,  Update September 12, 2020 SS:   Extensive laboratory evaluation in April 2021 (TSH, HIV, RPR, B12, ANA, vitamin D, sed rate, CBC, CMP) showed significantly low vitamin D 7.8, started on 50,000 unit prescription, followed by OTC 2000 units daily.   LP in May 2022 showed normal opening pressure, normal spinal fluid testing.  Opening pressure was 13 cm.   REVIEW OF SYSTEMS: Out of a complete 14 system review of symptoms, the patient complains only of the following symptoms, and all other reviewed systems are negative.  ALLERGIES: Allergies  Allergen Reactions   Onion Swelling   Other Other (See Comments)    Green  pepper - unknown    HOME MEDICATIONS: Outpatient Medications Prior to Visit  Medication Sig Dispense Refill   ACCU-CHEK GUIDE test strip ACCU-CHECK GUIDE TEST STRIPS. USE DAILY AS INSTRUCTED 100 strip 12   Accu-Chek Softclix Lancets lancets Use as instructed 100 each 12   acetaminophen (TYLENOL) 500 MG tablet Take 1,000 mg by mouth every 6 (six) hours as needed for moderate pain.     albuterol (VENTOLIN HFA) 108 (90 Base) MCG/ACT inhaler Inhale into the lungs every 6 (six) hours as needed for wheezing or shortness of breath.     blood glucose meter kit and supplies Dispense based on patient and insurance preference. Use daily to monitor blood sugar. (E11.65) 1 each 0   dicyclomine (BENTYL) 20 MG tablet Take 1 tablet (20 mg total) by mouth 2 (two) times daily. 20 tablet 0   metFORMIN (GLUCOPHAGE) 500 MG tablet Take 1 tablet (500 mg total) by mouth 2 (two) times daily with a meal. 180 tablet 3   ondansetron (ZOFRAN ODT) 4 MG disintegrating tablet Take 1 tablet (4 mg total) by mouth every 8 (eight) hours as needed for nausea or vomiting. 20 tablet 0   ondansetron (ZOFRAN ODT) 4 MG disintegrating tablet Take 1 tablet (4 mg total) by mouth every 8 (eight) hours as needed. 20 tablet 6   pantoprazole (PROTONIX) 20 MG tablet Take 1 tablet (20 mg total) by mouth daily. 30 tablet 0   potassium chloride SA (KLOR-CON) 20 MEQ tablet Take 2 tablets (40 mEq total) by mouth daily. 14 tablet 0   rizatriptan (MAXALT-MLT) 10 MG disintegrating tablet Take 1 tablet (10 mg total) by mouth  as needed. May repeat in 2 hours if needed 15 tablet 6   sertraline (ZOLOFT) 50 MG tablet TAKE 1 TABLET BY MOUTH EVERY DAY (Patient taking differently: Take 50 mg by mouth daily.) 90 tablet 2   topiramate (TOPAMAX) 100 MG tablet Take 1 tablet (100 mg total) by mouth 2 (two) times daily. 60 tablet 11   traMADol (ULTRAM) 50 MG tablet Take 1 every 6-8 hours for pain not helped by Tylenol or Motrin 10 tablet 0   UNABLE TO FIND  Accu-check guide monitor system 1 each 0   Vitamin D, Ergocalciferol, (DRISDOL) 1.25 MG (50000 UNIT) CAPS capsule Take 1 capsule (50,000 Units total) by mouth every 7 (seven) days. 5 capsule 0   No facility-administered medications prior to visit.    PAST MEDICAL HISTORY: Past Medical History:  Diagnosis Date   Anxiety    Phreesia 04/19/2020   Back pain    Depression    Diabetes mellitus without complication ([place])    Papilledema     PAST SURGICAL HISTORY: Past Surgical History:  Procedure Laterality Date   WISDOM TOOTH EXTRACTION Bilateral    2 removed    FAMILY HISTORY: Family History  Problem Relation Age of Onset   Heart failure Mother    Hypertension Mother    Heart disease Mother    Kidney disease Mother    Aortic aneurysm Mother    Sickle cell anemia Father     SOCIAL HISTORY: Social History   Socioeconomic History   Marital status: Single    Spouse name: Not on file   Number of children: 0   Years of education: college   Highest education level: Associate degree: academic program  Occupational History   Occupation: personal care  Tobacco Use   Smoking status: Every Day    Packs/day: 0.25    Pack years: 0.00    Types: Cigarettes   Smokeless tobacco: Never  Vaping Use   Vaping Use: Never used  Substance and Sexual Activity   Alcohol use: Yes    Comment: rarely   Drug use: No   Sexual activity: Yes    Birth control/protection: None  Other Topics Concern   Not on file  Social History Narrative   Lives with significant other.   Right-handed.   No daily use of caffeine.   Social Determinants of Health   Financial Resource Strain: Not on file  Food Insecurity: Not on file  Transportation Needs: Not on file  Physical Activity: Not on file  Stress: Not on file  Social Connections: Not on file  Intimate Partner Violence: Not on file      PHYSICAL EXAM  There were no vitals filed for this visit. There is no height or weight on file to  calculate BMI.  Generalized: Well developed, in no acute distress   Neurological examination  Mentation: Alert oriented to time, place, history taking. Follows all commands speech and language fluent Cranial nerve II-XII: Pupils were equal round reactive to light. Extraocular movements were full, visual field were full on confrontational test. Facial sensation and strength were normal. Uvula tongue midline. Head turning and shoulder shrug  were normal and symmetric. Motor: The motor testing reveals 5 over 5 strength of all 4 extremities. Good symmetric motor tone is noted throughout.  Sensory: Sensory testing is intact to soft touch on all 4 extremities. No evidence of extinction is noted.  Coordination: Cerebellar testing reveals good finger-nose-finger and heel-to-shin bilaterally.  Gait and station: Gait is normal. Tandem  gait is normal. Romberg is negative. No drift is seen.  Reflexes: Deep tendon reflexes are symmetric and normal bilaterally.   DIAGNOSTIC DATA (LABS, IMAGING, TESTING) - I reviewed patient records, labs, notes, testing and imaging myself where available.  Lab Results  Component Value Date   WBC 6.3 09/04/2020   HGB 13.0 09/04/2020   HCT 40.7 09/04/2020   MCV 89.5 09/04/2020   PLT 378 09/04/2020      Component Value Date/Time   NA 138 09/04/2020 1517   NA 139 07/04/2020 0851   K 3.6 09/04/2020 1517   CL 107 09/04/2020 1517   CO2 26 09/04/2020 1517   GLUCOSE 121 (H) 09/04/2020 1517   BUN 8 09/04/2020 1517   BUN 5 (L) 07/04/2020 0851   CREATININE 0.67 09/04/2020 1517   CALCIUM 8.9 09/04/2020 1517   PROT 7.6 07/04/2020 0851   ALBUMIN 3.7 (L) 07/04/2020 0851   AST 19 07/04/2020 0851   ALT 22 07/04/2020 0851   ALKPHOS 69 07/04/2020 0851   BILITOT 0.2 07/04/2020 0851   GFRNONAA >60 09/04/2020 1517   GFRAA >60 10/04/2017 1953   No results found for: CHOL, HDL, LDLCALC, LDLDIRECT, TRIG, CHOLHDL No results found for: HGBA1C Lab Results  Component Value Date    VITAMINB12 481 07/04/2020   Lab Results  Component Value Date   TSH 0.862 07/04/2020      ASSESSMENT AND PLAN 20 y.o. year old female  has a past medical history of Anxiety, Back pain, Depression, Diabetes mellitus without complication ([place]), and Papilledema. here with:  1.  Bilateral papillary edema 2.  Migraine headaches 3.  Morbid obesity  Extensive laboratory evaluation in April 2021 (TSH, HIV, RPR, B12, ANA, vitamin D, sed rate, CBC, CMP) showed significantly low vitamin D 7.8, started on 50,000 unit prescription, followed by OTC 2000 units daily.   LP in May 2022 showed normal opening pressure, normal spinal fluid testing.  Opening pressure was 13 cm.    I spent 15 minutes with the patient. 50% of this time was spent   Butler Denmark, [place], DNP 09/12/2020, 5:45 AM [facility] [address] [place], [place] [address] [phone]

## 2021-01-09 ENCOUNTER — Telehealth: Payer: Self-pay | Admitting: *Deleted

## 2021-01-09 NOTE — Telephone Encounter (Signed)
Pt has not had appt with gray since January 2021 needs appt scheduled with [facility] screening

## 2021-01-16 NOTE — Telephone Encounter (Signed)
NO ANSWER

## 2021-04-07 ENCOUNTER — Other Ambulatory Visit: Payer: Self-pay

## 2021-04-07 ENCOUNTER — Encounter: Payer: Self-pay | Admitting: Adult Health

## 2021-04-07 ENCOUNTER — Ambulatory Visit (INDEPENDENT_AMBULATORY_CARE_PROVIDER_SITE_OTHER): Payer: Medicaid Other | Admitting: Adult Health

## 2021-04-07 VITALS — BP 137/89 | HR 102 | Ht 62.0 in | Wt 326.0 lb

## 2021-04-07 DIAGNOSIS — F419 Anxiety disorder, unspecified: Secondary | ICD-10-CM

## 2021-04-07 DIAGNOSIS — R5383 Other fatigue: Secondary | ICD-10-CM | POA: Diagnosis not present

## 2021-04-07 DIAGNOSIS — Z3202 Encounter for pregnancy test, result negative: Secondary | ICD-10-CM | POA: Diagnosis not present

## 2021-04-07 DIAGNOSIS — Z319 Encounter for procreative management, unspecified: Secondary | ICD-10-CM

## 2021-04-07 DIAGNOSIS — F32A Depression, unspecified: Secondary | ICD-10-CM

## 2021-04-07 DIAGNOSIS — N926 Irregular menstruation, unspecified: Secondary | ICD-10-CM

## 2021-04-07 DIAGNOSIS — Z113 Encounter for screening for infections with a predominantly sexual mode of transmission: Secondary | ICD-10-CM

## 2021-04-07 LAB — POCT URINE PREGNANCY: Preg Test, Ur: NEGATIVE

## 2021-04-07 MED ORDER — PRENATAL PLUS 27-1 MG PO TABS: 1.0000 | ORAL_TABLET | Freq: Every day | ORAL | 12 refills | Status: DC

## 2021-04-07 MED ORDER — ESCITALOPRAM OXALATE 10 MG PO TABS: 10.0000 mg | ORAL_TABLET | Freq: Every day | ORAL | 2 refills | Status: DC

## 2021-04-07 NOTE — Progress Notes (Addendum)
Subjective:     Patient ID: Savannah Parker, female   DOB: December 08, 2000, 21 y.o.   MRN: [id number]  HPI Savannah Parker is a 21 year old black female, married, G2P0020, in for UPT, periods are irregular and recently has had some nausea and vomiting and tired. She had to take provera in June for period to come on , and them no period still about 2 months and spotted for 2 days. She wants to be pregnant. She said she had [place] in [place] and ovaries were good.  No current PCP  Review of Systems +irregular periods +nausea and vomiting, better now +tired +swelling in feet Reviewed past medical,surgical, social and family history. Reviewed medications and allergies.     Objective:   Physical Exam BP 137/89 (BP Location: Right Arm, Patient Position: Sitting, Cuff Size: Large)    Pulse (!) 102    Ht 5\' 2"  (1.575 m)    Wt (!) 326 lb (147.9 kg)    BMI 59.63 kg/m  UPT is negatiive. Skin warm and dry. Neck: mid line trachea, normal thyroid, good ROM, no lymphadenopathy noted. Lungs: clear to ausculation bilaterally. Cardiovascular: regular rate and rhythm.    AA is 1 Fall risk is moderate Depression screen [facility] 2/9 04/07/2021 04/22/2020  Decreased Interest 1 2  Down, Depressed, Hopeless 0 3  PHQ - 2 Score 1 5  Altered sleeping 2 2  Tired, decreased energy 2 3  Change in appetite 2 3  Feeling bad or failure about yourself  0 1  Trouble concentrating 0 3  Moving slowly or fidgety/restless 0 0  Suicidal thoughts 0 0  PHQ-9 Score 7 17   Was on zoloft did not help GAD 7 : Generalized Anxiety Score 04/07/2021 04/22/2020  Nervous, Anxious, on Edge 1 3  Control/stop worrying 1 3  Worry too much - different things 1 3  Trouble relaxing 1 3  Restless 0 1  Easily annoyed or irritable 1 3  Afraid - awful might happen 0 3  Total GAD 7 Score 5 19    Upstream - 04/07/21 1439       Pregnancy Intention Screening   Does the patient want to become pregnant in the next year? Yes    Does the patient's partner want to  become pregnant in the next year? Yes    Would the patient like to discuss contraceptive options today? No      Contraception Wrap Up   Current Method Pregnant/Seeking Pregnancy    End Method Pregnant/Seeking Pregnancy               Assessment:      1. Pregnancy examination or test, negative result  - POCT urine pregnancy - Beta hCG quant (ref lab)  2. Irregular periods Will check labs today and talk when results back  - Progesterone  3. Tired  - CBC - Comprehensive metabolic panel - TSH - T4, free  4. Anxiety and depression Will rx lexapro  Meds ordered this encounter  Medications   escitalopram (LEXAPRO) 10 MG tablet    Sig: Take 1 tablet (10 mg total) by mouth daily.    Dispense:  30 tablet    Refill:  2    Order Specific Question:   Supervising Provider    Answer:   Elonda Husky, LUTHER H [[id number]]   prenatal vitamin w/FE, FA (PRENATAL 1 + 1) 27-1 MG TABS tablet    Sig: Take 1 tablet by mouth daily at 12 noon.    Dispense:  30 tablet    Refill:  12    Order Specific Question:   Supervising Provider    Answer:   Elonda Husky, LUTHER H [[id number]]     5. Morbid obesity ([place])  6. Patient desires pregnancy Will rx PNV - Progesterone     7. STD screening Urine sent for GC/CHL  Plan:    Will see back in 2 weeks for pap and physical and discuss getting pregnant

## 2021-04-07 NOTE — Addendum Note (Signed)
Addended by: Colen Darling on: 04/07/2021 03:21 PM   Modules accepted: Orders

## 2021-04-08 LAB — COMPREHENSIVE METABOLIC PANEL
ALT: 34 IU/L — ABNORMAL HIGH (ref 0–32)
AST: 26 IU/L (ref 0–40)
Albumin/Globulin Ratio: 1.2 (ref 1.2–2.2)
Albumin: 4.2 g/dL (ref 3.9–5.0)
Alkaline Phosphatase: 81 IU/L (ref 44–121)
BUN/Creatinine Ratio: 10 (ref 9–23)
BUN: 6 mg/dL (ref 6–20)
Bilirubin Total: 0.2 mg/dL (ref 0.0–1.2)
CO2: 23 mmol/L (ref 20–29)
Calcium: 9.5 mg/dL (ref 8.7–10.2)
Chloride: 102 mmol/L (ref 96–106)
Creatinine, Ser: 0.62 mg/dL (ref 0.57–1.00)
Globulin, Total: 3.4 g/dL (ref 1.5–4.5)
Glucose: 112 mg/dL — ABNORMAL HIGH (ref 70–99)
Potassium: 4.5 mmol/L (ref 3.5–5.2)
Sodium: 141 mmol/L (ref 134–144)
Total Protein: 7.6 g/dL (ref 6.0–8.5)
eGFR: 130 mL/min/{1.73_m2} (ref >59)

## 2021-04-08 LAB — CBC
Hematocrit: 42.9 % (ref 34.0–46.6)
Hemoglobin: 14.2 g/dL (ref 11.1–15.9)
MCH: 29.3 pg (ref 26.6–33.0)
MCHC: 33.1 g/dL (ref 31.5–35.7)
MCV: 89 fL (ref 79–97)
Platelets: 402 10*3/uL (ref 150–450)
RBC: 4.85 x10E6/uL (ref 3.77–5.28)
RDW: 13.2 % (ref 11.7–15.4)
WBC: 8.9 10*3/uL (ref 3.4–10.8)

## 2021-04-08 LAB — BETA HCG QUANT (REF LAB): hCG Quant: <1 m[IU]/mL

## 2021-04-08 LAB — T4, FREE: Free T4: 1.48 ng/dL (ref 0.82–1.77)

## 2021-04-08 LAB — PROGESTERONE: Progesterone: <0.1 ng/mL

## 2021-04-08 LAB — TSH: TSH: 1.15 u[IU]/mL (ref 0.450–4.500)

## 2021-04-09 LAB — GC/CHLAMYDIA PROBE AMP
Chlamydia trachomatis, NAA: NEGATIVE
Neisseria Gonorrhoeae by PCR: NEGATIVE

## 2021-04-22 ENCOUNTER — Other Ambulatory Visit (HOSPITAL_COMMUNITY)
Admission: RE | Admit: 2021-04-22 | Discharge: 2021-04-22 | Disposition: A | Discharge status: Home / Self Care | Payer: Medicaid Other | Source: Ambulatory Visit | Attending: Adult Health | Admitting: Adult Health

## 2021-04-22 ENCOUNTER — Ambulatory Visit (INDEPENDENT_AMBULATORY_CARE_PROVIDER_SITE_OTHER): Payer: Medicaid Other | Admitting: Adult Health

## 2021-04-22 ENCOUNTER — Other Ambulatory Visit: Payer: Self-pay

## 2021-04-22 ENCOUNTER — Encounter: Payer: Self-pay | Admitting: Adult Health

## 2021-04-22 VITALS — BP 139/96 | HR 99 | Ht 63.0 in | Wt 326.0 lb

## 2021-04-22 DIAGNOSIS — Z01419 Encounter for gynecological examination (general) (routine) without abnormal findings: Secondary | ICD-10-CM

## 2021-04-22 DIAGNOSIS — N926 Irregular menstruation, unspecified: Secondary | ICD-10-CM | POA: Diagnosis not present

## 2021-04-22 DIAGNOSIS — Z319 Encounter for procreative management, unspecified: Secondary | ICD-10-CM | POA: Diagnosis not present

## 2021-04-22 DIAGNOSIS — F17200 Nicotine dependence, unspecified, uncomplicated: Secondary | ICD-10-CM

## 2021-04-22 LAB — POCT URINE PREGNANCY: Preg Test, Ur: NEGATIVE

## 2021-04-22 MED ORDER — MEDROXYPROGESTERONE ACETATE 10 MG PO TABS: ORAL_TABLET | ORAL | 0 refills | Status: DC

## 2021-04-22 NOTE — Progress Notes (Signed)
Patient ID: Savannah Parker, female   DOB: [birth year], 21 y.o.   MRN: [id number] History of Present Illness: Savannah Parker is a 21 year old black female,married, G2P0020, in for a well woman gyn exam and pap.  She is diabetic and says A1c 7 or so.  PCP is Dr Posey Pronto.   Current Medications, Allergies, Past Medical History, Past Surgical History, Family History and Social History were reviewed in [employer] record.     Review of Systems:  Patient denies any headaches, hearing loss, fatigue, blurred vision, shortness of breath, chest pain, abdominal pain, problems with bowel movements, urination, or intercourse. No joint pain or mood swings.  Periods are irregular   Physical Exam:BP (!) 139/96 (BP Location: Left Arm, Patient Position: Sitting, Cuff Size: Normal)    Pulse 99    Ht 5\' 3"  (1.6 m)    Wt (!) 326 lb (147.9 kg)    LMP  (LMP Unknown) Comment: May 2022, spotted 6 months ago   BMI 57.75 kg/m  UPT is negative. General:  Well developed, well nourished, no acute distress Skin:  Warm and dry Neck:  Midline trachea, normal thyroid, good ROM, no lymphadenopathy Lungs; Clear to auscultation bilaterally Breast:  No dominant palpable mass, retraction, or nipple discharge Cardiovascular: Regular rate and rhythm Abdomen:  Soft, non tender, no hepatosplenomegaly Pelvic:  External genitalia is normal in appearance, no lesions.  The vagina is normal in appearance. Urethra has no lesions or masses. The cervix is smooth, pap with GC/CHL performed.   Uterus is felt to be normal size, shape, and contour.  No adnexal masses or tenderness noted.Bladder is non tender, no masses felt. Has some tenderness LLQ in abd. Wall. Extremities/musculoskeletal:  No swelling or varicosities noted, no clubbing or cyanosis Psych:  No mood changes, alert and cooperative,seems happy AA is 2 Fall risk is low Depression screen [facility] 2/9 04/22/2021 04/07/2021 04/22/2020  Decreased Interest 0 1 2  Down, Depressed,  Hopeless 1 0 3  PHQ - 2 Score 1 1 5   Altered sleeping 0 2 2  Tired, decreased energy 1 2 3   Change in appetite 0 2 3  Feeling bad or failure about yourself  0 0 1  Trouble concentrating 0 0 3  Moving slowly or fidgety/restless 0 0 0  Suicidal thoughts 0 0 0  PHQ-9 Score 2 7 17     GAD 7 : Generalized Anxiety Score 04/22/2021 04/07/2021 04/22/2020  Nervous, Anxious, on Edge 1 1 3   Control/stop worrying 0 1 3  Worry too much - different things 1 1 3   Trouble relaxing 1 1 3   Restless 0 0 1  Easily annoyed or irritable 1 1 3   Afraid - awful might happen 0 0 3  Total GAD 7 Score 4 5 19       Upstream - 04/22/21 0910       Pregnancy Intention Screening   Does the patient want to become pregnant in the next year? Yes    Does the patient's partner want to become pregnant in the next year? Yes    Would the patient like to discuss contraceptive options today? No      Contraception Wrap Up   Current Method Pregnant/Seeking Pregnancy    End Method Pregnant/Seeking Pregnancy    Contraception Counseling Provided No            Examination chaperoned by Marcelino Scot RN  Impression and Plan: 1. Encounter for gynecological examination with Papanicolaou smear of cervix Pap sent Physical  in 1 year Pap in 3 if normal Follow up with PCP in diabetes   2. Irregular periods Will rx provera 10 mg x 10 days to get withdrawal bleed Follow up with me in 3 weeks to see if had period, will start clomid  3. Patient desires pregnancy Take PNV Will want A1c 7 or lower  4. Smoker Decrease smoking and try to quit  5. Morbid obesity ([place]) Decrease calories and portions, weight loss will be beneficial to getting pregnant

## 2021-04-23 LAB — CYTOLOGY - PAP
Chlamydia: NEGATIVE
Comment: NEGATIVE
Comment: NEGATIVE
Comment: NORMAL
Diagnosis: NEGATIVE
High risk HPV: NEGATIVE
Neisseria Gonorrhea: NEGATIVE

## 2021-05-13 ENCOUNTER — Encounter: Payer: Self-pay | Admitting: Adult Health

## 2021-05-13 ENCOUNTER — Other Ambulatory Visit: Payer: Self-pay

## 2021-05-13 ENCOUNTER — Ambulatory Visit: Payer: Medicaid Other | Admitting: Adult Health

## 2021-05-13 VITALS — BP 146/95 | HR 106 | Ht 63.0 in | Wt 325.2 lb

## 2021-05-13 DIAGNOSIS — Z319 Encounter for procreative management, unspecified: Secondary | ICD-10-CM | POA: Diagnosis not present

## 2021-05-13 DIAGNOSIS — L0292 Furuncle, unspecified: Secondary | ICD-10-CM | POA: Diagnosis not present

## 2021-05-13 MED ORDER — SULFAMETHOXAZOLE-TRIMETHOPRIM 800-160 MG PO TABS: 1.0000 | ORAL_TABLET | Freq: Two times a day (BID) | ORAL | 0 refills | Status: DC

## 2021-05-13 NOTE — Progress Notes (Signed)
°  Subjective:     Patient ID: Savannah Parker, female   DOB: April 13, 2000, 21 y.o.   MRN: [id number]  HPI [name] is a 21 year old black female,married, G0P0, back in follow up on taking provera and did get period, and is still on, also complains of boil back of right thigh, that is tender.  Lab Results  Component Value Date   DIAGPAP  04/22/2021    - Negative for intraepithelial lesion or malignancy (NILM)   HPVHIGH Negative 04/22/2021   PCP is Dr Allena Katz  Review of Systems +period after provera +boil back of right thigh Reviewed past medical,surgical, social and family history. Reviewed medications and allergies.     Objective:   Physical Exam BP (!) 146/95 (BP Location: Right Arm, Patient Position: Sitting, Cuff Size: Normal)    Pulse (!) 106    Ht 5\' 3"  (1.6 m)    Wt (!) 325 lb 3.2 oz (147.5 kg)    LMP 05/06/2021 (Exact Date)    BMI 57.61 kg/m     Skin warm and dry. Lungs: clear to ausculation bilaterally. Cardiovascular: regular rate and rhythm. Has 3 cm firm tender area back of right thigh, slight blood seen   Upstream - 05/13/21 0917       Pregnancy Intention Screening   Does the patient want to become pregnant in the next year? Yes    Does the patient's partner want to become pregnant in the next year? Yes    Would the patient like to discuss contraceptive options today? No      Contraception Wrap Up   Current Method Pregnant/Seeking Pregnancy    End Method Pregnant/Seeking Pregnancy    Contraception Counseling Provided No             Assessment:     1. Patient desires pregnancy Will check progesterone level 05/26/21 to see if ovulated, if not will consider clomid  2. Boil Will rx septra ds and use warm compresses or tub bath Do not squeeze Meds ordered this encounter  Medications   sulfamethoxazole-trimethoprim (BACTRIM DS) 800-160 MG tablet    Sig: Take 1 tablet by mouth 2 (two) times daily. Take 1 bid    Dispense:  28 tablet    Refill:  0    Order Specific  Question:   Supervising Provider    Answer:   05/28/21 [[id number]]   Will follow up in 1 week    Plan:     Progesterone level 05/26/21 has orders Follow up 05/19/21 to recheck boil

## 2021-05-20 ENCOUNTER — Ambulatory Visit: Payer: Medicaid Other | Admitting: Adult Health

## 2021-05-20 ENCOUNTER — Other Ambulatory Visit: Payer: Self-pay

## 2021-05-20 ENCOUNTER — Encounter: Payer: Self-pay | Admitting: Adult Health

## 2021-05-20 VITALS — BP 142/89 | HR 99 | Ht 63.0 in | Wt 322.0 lb

## 2021-05-20 DIAGNOSIS — Z319 Encounter for procreative management, unspecified: Secondary | ICD-10-CM | POA: Diagnosis not present

## 2021-05-20 DIAGNOSIS — L0292 Furuncle, unspecified: Secondary | ICD-10-CM | POA: Diagnosis not present

## 2021-05-20 NOTE — Progress Notes (Signed)
°  Subjective:     Patient ID: Savannah Parker, female   DOB: 2000/08/10, 21 y.o.   MRN: [id number]  HPI Savannah Parker is a 21 year old black female, married, G2P0020, back in follow up on boil right posterior thigh. She is on septra ds, she said it burst and drained on its own. Feels better. Lab Results  Component Value Date   DIAGPAP  04/22/2021    - Negative for intraepithelial lesion or malignancy (NILM)   HPVHIGH Negative 04/22/2021    Review of Systems Boil burst and drained on its own. Feels better  Reviewed past medical,surgical, social and family history. Reviewed medications and allergies.     Objective:   Physical Exam BP (!) 142/89 (BP Location: Left Arm, Patient Position: Sitting, Cuff Size: Normal) Comment (BP Location): lower arm   Pulse 99    Ht 5\' 3"  (1.6 m)    Wt (!) 322 lb (146.1 kg)    LMP 05/06/2021 (Exact Date)    BMI 57.04 kg/m     Skin warm and dry.Boil right posterior thigh has resolved. Fall risk is moderated Depression screen [facility] 2/9 05/20/2021 04/22/2021 04/07/2021  Decreased Interest 0 0 1  Down, Depressed, Hopeless 0 1 0  PHQ - 2 Score 0 1 1  Altered sleeping - 0 2  Tired, decreased energy - 1 2  Change in appetite - 0 2  Feeling bad or failure about yourself  - 0 0  Trouble concentrating - 0 0  Moving slowly or fidgety/restless - 0 0  Suicidal thoughts - 0 0  PHQ-9 Score - 2 7     Upstream - 05/20/21 0846       Pregnancy Intention Screening   Does the patient want to become pregnant in the next year? Yes    Does the patient's partner want to become pregnant in the next year? Yes    Would the patient like to discuss contraceptive options today? No      Contraception Wrap Up   Current Method Pregnant/Seeking Pregnancy    End Method Pregnant/Seeking Pregnancy    Contraception Counseling Provided No             Assessment:     1. Boil, has resolved Finish antibiotic  2. Patient desires pregnancy Get progesterone level 05/26/21    Plan:     Follow  up prn

## 2021-05-27 LAB — PROGESTERONE: Progesterone: <0.1 ng/mL

## 2021-07-01 ENCOUNTER — Ambulatory Visit: Payer: Medicaid Other | Admitting: Adult Health

## 2021-07-01 ENCOUNTER — Encounter: Payer: Self-pay | Admitting: Adult Health

## 2021-07-01 ENCOUNTER — Ambulatory Visit (INDEPENDENT_AMBULATORY_CARE_PROVIDER_SITE_OTHER): Payer: Medicaid Other | Admitting: Adult Health

## 2021-07-01 VITALS — BP 145/78 | HR 106 | Ht 63.0 in | Wt 323.0 lb

## 2021-07-01 DIAGNOSIS — L0292 Furuncle, unspecified: Secondary | ICD-10-CM | POA: Diagnosis not present

## 2021-07-01 DIAGNOSIS — Z319 Encounter for procreative management, unspecified: Secondary | ICD-10-CM

## 2021-07-01 DIAGNOSIS — F17200 Nicotine dependence, unspecified, uncomplicated: Secondary | ICD-10-CM

## 2021-07-01 DIAGNOSIS — R5383 Other fatigue: Secondary | ICD-10-CM | POA: Diagnosis not present

## 2021-07-01 DIAGNOSIS — N926 Irregular menstruation, unspecified: Secondary | ICD-10-CM | POA: Diagnosis not present

## 2021-07-01 DIAGNOSIS — R7309 Other abnormal glucose: Secondary | ICD-10-CM

## 2021-07-01 MED ORDER — SULFAMETHOXAZOLE-TRIMETHOPRIM 800-160 MG PO TABS: 1.0000 | ORAL_TABLET | Freq: Two times a day (BID) | ORAL | 0 refills | Status: DC

## 2021-07-01 NOTE — Progress Notes (Signed)
?  Subjective:  ?  ? Patient ID: Savannah Parker, female   DOB: 28-Jan-1994, 21 y.o.   MRN: [id number] ? ?HPI ?Savannah Parker is a 21 year old black female, married, G2P0020, in complaining of bump under right arm, tender,not has red or swollen. ? ?Lab Results  ?Component Value Date  ? DIAGPAP  04/22/2021  ?  - Negative for intraepithelial lesion or malignancy (NILM)  ? [place] Negative 04/22/2021  ?  ? ?Review of Systems ?Has bump right under arm, is not as red or swollen, but still tender ?+tired ?Periods irregular. ?Still wants to get pregnant. Progesterone <0.1 on 05/26/21.  ?Reviewed past medical,surgical, social and family history. Reviewed medications and allergies.  ?   ?Objective:  ? Physical Exam ?BP (!) 145/78 (BP Location: Left Arm, Patient Position: Sitting, Cuff Size: Large)   Pulse (!) 106   Ht 5\' 3"  (1.6 m)   Wt (!) 323 lb (146.5 kg)   BMI 57.22 kg/m?   ?  Has tender, area right upper under arm about 3 cm skin thickened not red, but was, and not as swollen  ? Upstream - 07/01/21 1544   ? ?  ? Pregnancy Intention Screening  ? Does the patient want to become pregnant in the next year? Yes   ? Does the patient's partner want to become pregnant in the next year? Yes   ? Would the patient like to discuss contraceptive options today? No   ?  ? Contraception Wrap Up  ? Current Method Pregnant/Seeking Pregnancy   ? End Method Pregnant/Seeking Pregnancy   ? ?  ?  ? ?  ?  ?Assessment:  ?   ?1. Boil ?Will rx septra ds ?Do not shave ?Do not use deodorant ?Wear loose sleeves ?Meds ordered this encounter  ?Medications  ? sulfamethoxazole-trimethoprim (BACTRIM DS) 800-160 MG tablet  ?  Sig: Take 1 tablet by mouth 2 (two) times daily. Take 1 bid  ?  Dispense:  28 tablet  ?  Refill:  0  ?  Order Specific Question:   Supervising Provider  ?  Answer:   Tania Ade H [[id number]]  ? Recheck in 3 weeks  ? ?2. Irregular periods ?Will follow  for now  ? ?3. Tired ?Has normal TSH and CBC in January  ? ?4. Patient desires pregnancy ?Continue  PNV ?Try to lose some weight ?Will consider clomid  ? ?5. Smoker ?Continue to decrease smoking ? ?6. Morbid obesity ([place]) ?Walk more, try to lose some wight ? ?7. Elevated hemoglobin A1c ?Check A1c ?   ?Plan:  ?   ?Follow up in 3 weeks  ?   ?

## 2021-07-08 ENCOUNTER — Ambulatory Visit: Payer: Medicaid Other | Admitting: Nurse Practitioner

## 2021-07-09 ENCOUNTER — Ambulatory Visit: Payer: Medicaid Other | Admitting: Family

## 2021-07-21 ENCOUNTER — Ambulatory Visit: Payer: Medicaid Other | Admitting: Adult Health

## 2021-07-25 ENCOUNTER — Telehealth: Payer: Self-pay | Admitting: Adult Health

## 2021-07-25 MED ORDER — ESCITALOPRAM OXALATE 10 MG PO TABS: 10.0000 mg | ORAL_TABLET | Freq: Every day | ORAL | 6 refills | Status: DC

## 2021-07-25 NOTE — Telephone Encounter (Signed)
Will refill lexapro 10 mg but not zoloft, did not prescribe that  ?

## 2021-07-25 NOTE — Addendum Note (Signed)
Addended by: Cyril Mourning A on: 07/25/2021 01:57 PM ? ? Modules accepted: Orders ? ?

## 2021-07-25 NOTE — Telephone Encounter (Signed)
Patient states that her anxiety medication should have had 12 refills that Mrs. Victorino Dike has done for her, but her pharmacy states that it is not on her chart anymore and when she looked at her Earleen Reaper it is not showing as well. Patient states she needs those mediation she has been on them for 2 years now and she is running out. Please contact pt. ?

## 2021-07-25 NOTE — Telephone Encounter (Signed)
Pt is needing a refill on Lexapro 10 mg takes 1 daily and Sertraline 50 mg takes 1 daily. Pt is out of both and needs refills. Thanks!! JSY ?

## 2021-07-25 NOTE — Telephone Encounter (Signed)
Patient calling wanting to speak to someone she states that she didn't tell anyone that she wanted to be taking of her medicine  ?

## 2021-08-29 ENCOUNTER — Ambulatory Visit: Payer: Medicaid Other | Admitting: Nurse Practitioner

## 2021-09-02 ENCOUNTER — Other Ambulatory Visit: Payer: Self-pay | Admitting: *Deleted

## 2021-09-03 MED ORDER — ESCITALOPRAM OXALATE 10 MG PO TABS: 10.0000 mg | ORAL_TABLET | Freq: Every day | ORAL | 3 refills | Status: AC

## 2021-09-21 ENCOUNTER — Other Ambulatory Visit: Payer: Self-pay

## 2021-09-21 ENCOUNTER — Emergency Department (HOSPITAL_COMMUNITY)
Admission: EM | Admit: 2021-09-21 | Discharge: 2021-09-21 | Disposition: A | Discharge status: Home / Self Care | Payer: Medicaid Other | Attending: Emergency Medicine | Admitting: Emergency Medicine

## 2021-09-21 ENCOUNTER — Encounter (HOSPITAL_COMMUNITY): Payer: Self-pay

## 2021-09-21 ENCOUNTER — Emergency Department (HOSPITAL_COMMUNITY): Payer: Medicaid Other

## 2021-09-21 DIAGNOSIS — E119 Type 2 diabetes mellitus without complications: Secondary | ICD-10-CM | POA: Diagnosis not present

## 2021-09-21 DIAGNOSIS — I10 Essential (primary) hypertension: Secondary | ICD-10-CM | POA: Insufficient documentation

## 2021-09-21 DIAGNOSIS — E860 Dehydration: Secondary | ICD-10-CM | POA: Diagnosis not present

## 2021-09-21 DIAGNOSIS — R112 Nausea with vomiting, unspecified: Secondary | ICD-10-CM | POA: Diagnosis present

## 2021-09-21 DIAGNOSIS — R197 Diarrhea, unspecified: Secondary | ICD-10-CM | POA: Insufficient documentation

## 2021-09-21 DIAGNOSIS — R109 Unspecified abdominal pain: Secondary | ICD-10-CM | POA: Diagnosis not present

## 2021-09-21 LAB — COMPREHENSIVE METABOLIC PANEL
ALT: 24 U/L (ref 0–44)
AST: 19 U/L (ref 15–41)
Albumin: 3.5 g/dL (ref 3.5–5.0)
Alkaline Phosphatase: 46 U/L (ref 38–126)
Anion gap: 7 (ref 5–15)
BUN: 9 mg/dL (ref 6–20)
CO2: 24 mmol/L (ref 22–32)
Calcium: 8.6 mg/dL — ABNORMAL LOW (ref 8.9–10.3)
Chloride: 107 mmol/L (ref 98–111)
Creatinine, Ser: 0.62 mg/dL (ref 0.44–1.00)
GFR, Estimated: >60 mL/min (ref >60)
Glucose, Bld: 122 mg/dL — ABNORMAL HIGH (ref 70–99)
Potassium: 3.4 mmol/L — ABNORMAL LOW (ref 3.5–5.1)
Sodium: 138 mmol/L (ref 135–145)
Total Bilirubin: 0.6 mg/dL (ref 0.3–1.2)
Total Protein: 7.5 g/dL (ref 6.5–8.1)

## 2021-09-21 LAB — CBC WITH DIFFERENTIAL/PLATELET
Abs Immature Granulocytes: 0.02 10*3/uL (ref 0.00–0.07)
Basophils Absolute: 0 10*3/uL (ref 0.0–0.1)
Basophils Relative: 0 %
Eosinophils Absolute: 0.3 10*3/uL (ref 0.0–0.5)
Eosinophils Relative: 3 %
HCT: 42.1 % (ref 36.0–46.0)
Hemoglobin: 13.9 g/dL (ref 12.0–15.0)
Immature Granulocytes: 0 %
Lymphocytes Relative: 26 %
Lymphs Abs: 2.1 10*3/uL (ref 0.7–4.0)
MCH: 29.1 pg (ref 26.0–34.0)
MCHC: 33 g/dL (ref 30.0–36.0)
MCV: 88.3 fL (ref 80.0–100.0)
Monocytes Absolute: 0.4 10*3/uL (ref 0.1–1.0)
Monocytes Relative: 5 %
Neutro Abs: 5.1 10*3/uL (ref 1.7–7.7)
Neutrophils Relative %: 66 %
Platelets: 419 10*3/uL — ABNORMAL HIGH (ref 150–400)
RBC: 4.77 MIL/uL (ref 3.87–5.11)
RDW: 13.4 % (ref 11.5–15.5)
WBC: 7.8 10*3/uL (ref 4.0–10.5)
nRBC: 0 % (ref 0.0–0.2)

## 2021-09-21 LAB — LIPASE, BLOOD: Lipase: 21 U/L (ref 11–51)

## 2021-09-21 LAB — PREGNANCY, URINE: Preg Test, Ur: NEGATIVE

## 2021-09-21 MED ORDER — ONDANSETRON HCL 4 MG PO TABS: 4.0000 mg | ORAL_TABLET | Freq: Four times a day (QID) | ORAL | 0 refills | Status: DC

## 2021-09-21 MED ORDER — SODIUM CHLORIDE 0.9 % IV BOLUS
1000.0000 mL | Freq: Once | INTRAVENOUS | Status: AC
  Administered 2021-09-21: 1000 mL via INTRAVENOUS

## 2021-09-21 MED ORDER — ONDANSETRON HCL 4 MG/2ML IJ SOLN
4.0000 mg | Freq: Once | INTRAMUSCULAR | Status: AC
  Administered 2021-09-21: 4 mg via INTRAVENOUS
  Filled 2021-09-21: qty 2

## 2021-09-21 MED ORDER — IOHEXOL 300 MG/ML  SOLN
100.0000 mL | Freq: Once | INTRAMUSCULAR | Status: AC | PRN
  Administered 2021-09-21: 100 mL via INTRAVENOUS

## 2021-09-21 MED ORDER — PROCHLORPERAZINE EDISYLATE 10 MG/2ML IJ SOLN
10.0000 mg | INTRAMUSCULAR | Status: AC
  Administered 2021-09-21: 10 mg via INTRAVENOUS
  Filled 2021-09-21: qty 2

## 2021-09-21 MED ORDER — MORPHINE SULFATE (PF) 4 MG/ML IV SOLN
4.0000 mg | Freq: Once | INTRAVENOUS | Status: AC
  Administered 2021-09-21: 4 mg via INTRAVENOUS
  Filled 2021-09-21: qty 1

## 2021-09-21 NOTE — ED Triage Notes (Signed)
Pov from home. Cc of emesis. 2pm today started vomiting since she ate. Diarrhea during one of the times she vomited.. Thrown up appx 6x. Says she feels tired

## 2022-03-20 IMAGING — CT CT ANGIO HEAD
2 of 13 series · 7 of 33 positions shown · IV contrast (omnipaque)
Comparison: None.

CLINICAL DATA: Dizziness with left hand numbness

EXAM:
CT ANGIOGRAPHY HEAD AND NECK
TECHNIQUE: Multidetector CT imaging of the head and neck was performed using
the standard protocol during bolus administration of intravenous
contrast. Multiplanar CT image reconstructions and MIPs were
obtained to evaluate the vascular anatomy. Carotid stenosis
measurements (when applicable) are obtained utilizing NASCET
criteria, using the distal internal carotid diameter as the
denominator.
CONTRAST:  100mL OMNIPAQUE IOHEXOL 350 MG/ML SOLN

[Series 6: cta head neck · axial · 0.50mm/px · z∈[-220,-100]mm · 2 of 181 slices shown]
[im 61/181  soft-tissue]
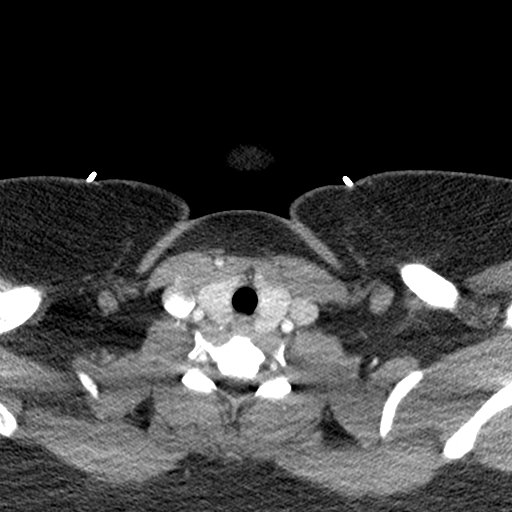
[im 121/181  soft-tissue]
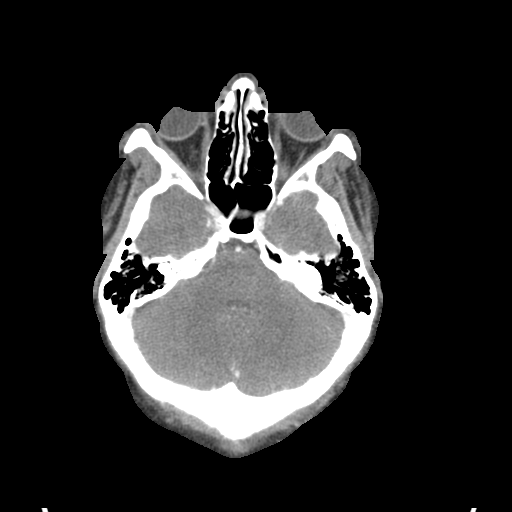

[Series 8: ax thin · axial · 0.39mm/px · z∈[-280,-40]mm · 5 of 360 slices shown]
[im 60/360  soft-tissue]
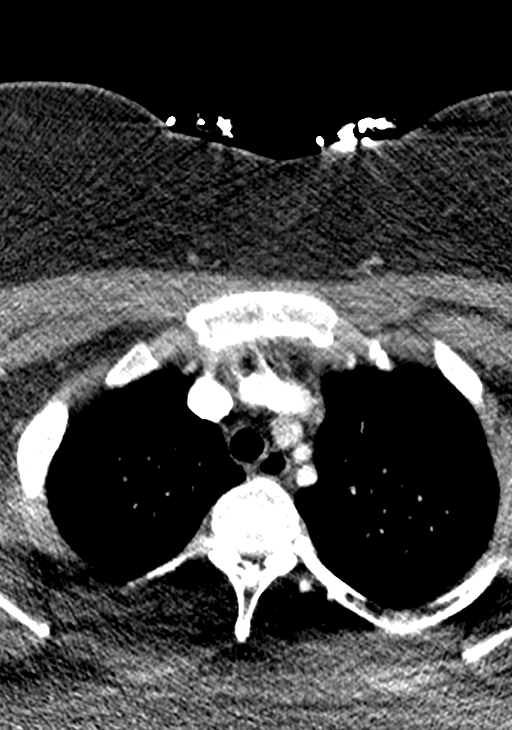
[im 120/360  bone]
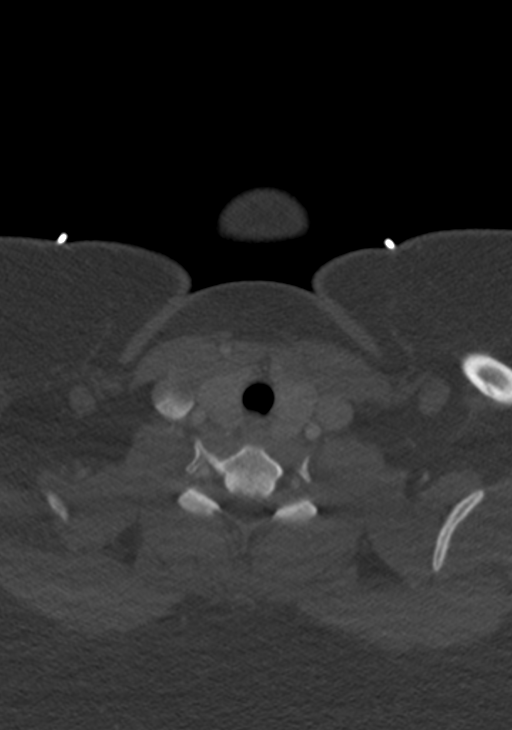
[im 180/360  soft-tissue]
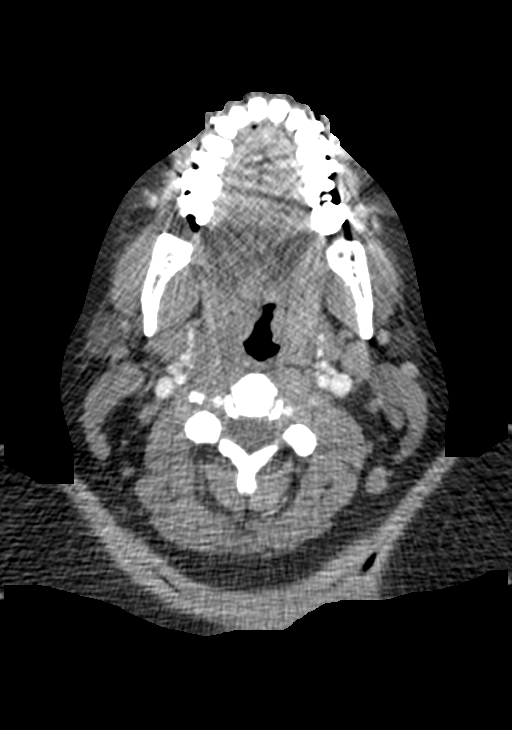
[im 240/360  bone]
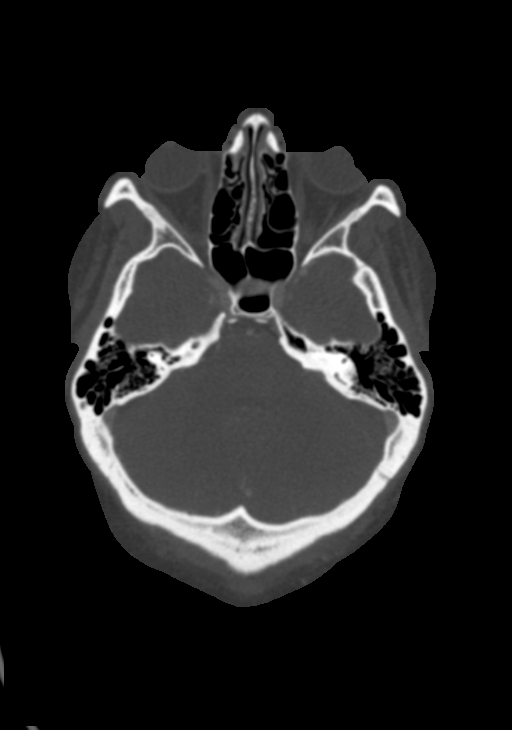
[im 300/360  soft-tissue]
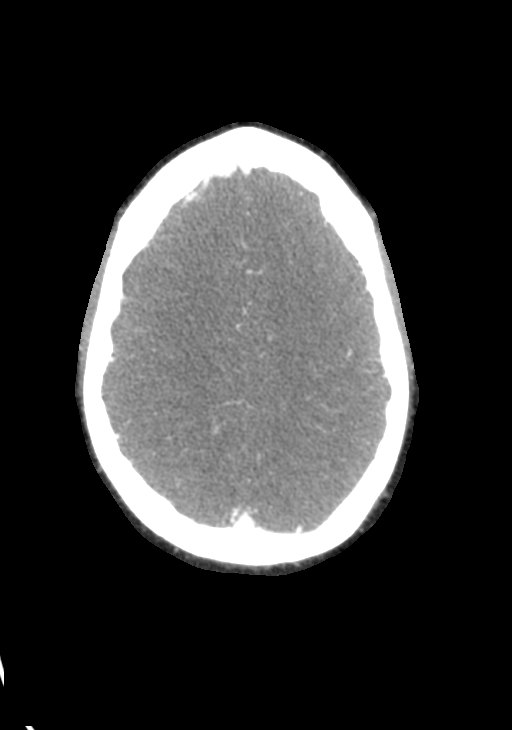

[7 of 33 positions shown; findings below may reference images not displayed]

FINDINGS: CTA NECK FINDINGS

SKELETON: There is no bony spinal canal stenosis. No lytic or
blastic lesion.

OTHER NECK: Normal pharynx, larynx and major salivary glands. No
cervical lymphadenopathy. Unremarkable thyroid gland.

UPPER CHEST: No pneumothorax or pleural effusion. No nodules or
masses.

AORTIC ARCH:

There is no calcific atherosclerosis of the aortic arch. There is no
aneurysm, dissection or hemodynamically significant stenosis of the
visualized portion of the aorta. Conventional 3 vessel aortic
branching pattern. The visualized proximal subclavian arteries are
widely patent.

RIGHT CAROTID SYSTEM: Normal without aneurysm, dissection or
stenosis.

LEFT CAROTID SYSTEM: Normal without aneurysm, dissection or
stenosis.

VERTEBRAL ARTERIES: Left dominant configuration. Both origins are
clearly patent. There is no dissection, occlusion or flow-limiting
stenosis to the skull base (V1-V3 segments).

CTA HEAD FINDINGS

POSTERIOR CIRCULATION:

--Vertebral arteries: Normal V4 segments.

--Inferior cerebellar arteries: Normal.

--Basilar artery: Normal.

--Superior cerebellar arteries: Normal.

--Posterior cerebral arteries (PCA): Normal.

ANTERIOR CIRCULATION:

--Intracranial internal carotid arteries: Normal.

--Anterior cerebral arteries (ACA): Normal. Both A1 segments are
present. Patent anterior communicating artery (a-comm).

--Middle cerebral arteries (MCA): Normal.

ANATOMIC VARIANTS: None

Review of the MIP images confirms the above findings.
IMPRESSION: Normal CTA of the head and neck.

## 2023-07-20 ENCOUNTER — Other Ambulatory Visit: Payer: Self-pay

## 2023-07-20 ENCOUNTER — Emergency Department (HOSPITAL_COMMUNITY)
Admission: EM | Admit: 2023-07-20 | Discharge: 2023-07-21 | Discharge status: Against Medical Advice | Payer: Self-pay | Attending: Emergency Medicine | Admitting: Emergency Medicine

## 2023-07-20 DIAGNOSIS — Z5321 Procedure and treatment not carried out due to patient leaving prior to being seen by health care provider: Secondary | ICD-10-CM | POA: Insufficient documentation

## 2023-07-20 DIAGNOSIS — R42 Dizziness and giddiness: Secondary | ICD-10-CM | POA: Insufficient documentation

## 2023-07-20 DIAGNOSIS — N939 Abnormal uterine and vaginal bleeding, unspecified: Secondary | ICD-10-CM | POA: Insufficient documentation
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
## 2023-07-21 LAB — CBC
HCT: 42.4 % (ref 36.0–46.0)
Hemoglobin: 13.9 g/dL (ref 12.0–15.0)
MCH: 29 pg (ref 26.0–34.0)
MCHC: 32.8 g/dL (ref 30.0–36.0)
MCV: 88.3 fL (ref 80.0–100.0)
Platelets: 317 10*3/uL (ref 150–400)
RBC: 4.8 MIL/uL (ref 3.87–5.11)
RDW: 12.8 % (ref 11.5–15.5)
WBC: 9.3 10*3/uL (ref 4.0–10.5)
nRBC: 0 % (ref 0.0–0.2)

## 2023-07-21 LAB — HCG, SERUM, QUALITATIVE: Preg, Serum: NEGATIVE

## 2023-07-21 NOTE — ED Provider Notes (Signed)
 Eloped prior to my evaluation.   Lindle Rhea, MD 07/21/23 [phone]

## 2023-07-21 NOTE — ED Notes (Signed)
 Pt says that she does not want to wait any longer, reports that she is feeling better and has an appointment with her OB in the morning, would just like to see her. Ambulatory with steady gait.

## 2023-08-09 ENCOUNTER — Ambulatory Visit: Payer: Self-pay | Admitting: Adult Health

## 2023-08-16 ENCOUNTER — Encounter: Payer: Self-pay | Admitting: Adult Health

## 2023-08-16 ENCOUNTER — Ambulatory Visit: Payer: Self-pay | Admitting: Adult Health

## 2023-08-16 VITALS — BP 132/78 | HR 109 | Ht 63.0 in | Wt 311.0 lb

## 2023-08-16 DIAGNOSIS — Z319 Encounter for procreative management, unspecified: Secondary | ICD-10-CM | POA: Diagnosis not present

## 2023-08-16 DIAGNOSIS — F17200 Nicotine dependence, unspecified, uncomplicated: Secondary | ICD-10-CM

## 2023-08-16 DIAGNOSIS — E119 Type 2 diabetes mellitus without complications: Secondary | ICD-10-CM | POA: Diagnosis not present

## 2023-08-16 DIAGNOSIS — R102 Pelvic and perineal pain: Secondary | ICD-10-CM

## 2023-08-16 DIAGNOSIS — Z3202 Encounter for pregnancy test, result negative: Secondary | ICD-10-CM

## 2023-08-16 DIAGNOSIS — N926 Irregular menstruation, unspecified: Secondary | ICD-10-CM

## 2023-08-16 DIAGNOSIS — N939 Abnormal uterine and vaginal bleeding, unspecified: Secondary | ICD-10-CM | POA: Diagnosis not present

## 2023-08-16 LAB — POCT URINE PREGNANCY: Preg Test, Ur: NEGATIVE

## 2023-08-16 MED ORDER — PRENATAL PLUS 27-1 MG PO TABS: 1.0000 | ORAL_TABLET | Freq: Every day | ORAL | 12 refills | Status: AC

## 2023-08-16 MED ORDER — NORETHINDRONE ACETATE 5 MG PO TABS: 5.0000 mg | ORAL_TABLET | Freq: Every day | ORAL | 0 refills | Status: DC

## 2023-08-16 NOTE — Progress Notes (Signed)
 Subjective:     Patient ID: Savannah Parker, female   DOB: Jan 16, 2001, 23 y.o.   MRN: [id number]  HPI Savannah Parker is a 23 year old black female,married, G2P0020, in complaining of no period in almost a year, and started bleeding 07/16/23 and still on, not has heavy, and is having cramping. She  would like to get pregnant. She is a diabetic not on any meds and not being followed by PCP or endocrinology. She went to ER 07/21/23 HCG was negative and HGB was 13.9 but she left without being seen.     Component Value Date/Time   DIAGPAP  04/22/2021 0901    - Negative for intraepithelial lesion or malignancy (NILM)   HPVHIGH Negative 04/22/2021 0901   ADEQPAP  04/22/2021 0901    Satisfactory for evaluation; transformation zone component PRESENT.    Review of Systems no period in almost a year, and started bleeding 07/16/23 and still on, not has heavy, and is having cramping. She is dizzy at times Reviewed past medical,surgical, social and family history. Reviewed medications and allergies.     Objective:   Physical Exam BP 132/78 (BP Location: Left Arm, Patient Position: Sitting, Cuff Size: Large)   Pulse (!) 109   Ht 5\' 3"  (1.6 m)   Wt (!) 311 lb (141.1 kg)   LMP 07/16/2023 (Exact Date)   BMI 55.09 kg/m  UPT negative    Skin warm and dry. Lungs: clear to ausculation bilaterally. Cardiovascular: regular rate and rhythm.  Pelvic: external genitalia is normal in appearance no lesions, vagina: scant blood,urethra has no lesions or masses noted, cervix:smooth, uterus: normal size, shape and contour, non tender, no masses felt, adnexa: no masses or tenderness noted. Bladder is non tender and no masses felt.   Upstream - 08/16/23 1415       Pregnancy Intention Screening   Does the patient want to become pregnant in the next year? Yes    Does the patient's partner want to become pregnant in the next year? Yes    Would the patient like to discuss contraceptive options today? No      Contraception Wrap  Up   Current Method Pregnant/Seeking Pregnancy    End Method Pregnant/Seeking Pregnancy    Contraception Counseling Provided No            Examination chaperoned by Alphonso Aschoff LPN  Assessment:     1. Pregnancy examination or test, negative result - POCT urine pregnancy  2. Vaginal bleeding ([place]) Bleeding since 07/16/23 not has heavy Will rx aygestin  5 mg 1 bid x 5 days to stop the bleeding Meds ordered this encounter  Medications   norethindrone  (AYGESTIN ) 5 MG tablet    Sig: Take 1 tablet (5 mg total) by mouth daily. Take 1 bid x 5 days to stop bleeding    Dispense:  10 tablet    Refill:  0    Supervising Provider:   Evalyn Hillier H [[id number]]   prenatal vitamin w/FE, FA (PRENATAL 1 + 1) 27-1 MG TABS tablet    Sig: Take 1 tablet by mouth daily at 12 noon.    Dispense:  30 tablet    Refill:  12    Supervising Provider:   Wendelyn Halter [[id number]]   Will check labs  - CBC  3. Irregular periods No period since last year then started bleeding 07/16/23 and still on Will check thyroid  - TSH + free T4  4. Patient desires pregnancy Will rx prenatal vitamins 1 daily  5. Smoker Trying to decrease   6. Type 2 diabetes mellitus without complication, without long-term current use of insulin (HCC) Will check A1c to see where she is, discussed needs to be controlled, before trying to get pregnant  May refer to endocrinology when labs back - Comprehensive metabolic panel with GFR - Hemoglobin A1c  7. Pelvic cramping Has some cramping     Plan:     Follow up in 2 weeks for ROS

## 2023-08-17 ENCOUNTER — Ambulatory Visit: Payer: Self-pay | Admitting: Adult Health

## 2023-08-17 DIAGNOSIS — E119 Type 2 diabetes mellitus without complications: Secondary | ICD-10-CM

## 2023-08-17 LAB — HEMOGLOBIN A1C
Est. average glucose Bld gHb Est-mCnc: 272 mg/dL
Hgb A1c MFr Bld: 11.1 % — ABNORMAL HIGH (ref 4.8–5.6)

## 2023-08-17 LAB — COMPREHENSIVE METABOLIC PANEL WITH GFR
ALT: 87 IU/L — ABNORMAL HIGH (ref 0–32)
AST: 56 IU/L — ABNORMAL HIGH (ref 0–40)
Albumin: 3.8 g/dL — ABNORMAL LOW (ref 4.0–5.0)
Alkaline Phosphatase: 89 IU/L (ref 44–121)
BUN/Creatinine Ratio: 9 (ref 9–23)
BUN: 5 mg/dL — ABNORMAL LOW (ref 6–20)
Bilirubin Total: 0.4 mg/dL (ref 0.0–1.2)
CO2: 22 mmol/L (ref 20–29)
Calcium: 9.1 mg/dL (ref 8.7–10.2)
Chloride: 102 mmol/L (ref 96–106)
Creatinine, Ser: 0.57 mg/dL (ref 0.57–1.00)
Globulin, Total: 3.3 g/dL (ref 1.5–4.5)
Glucose: 239 mg/dL — ABNORMAL HIGH (ref 70–99)
Potassium: 3.8 mmol/L (ref 3.5–5.2)
Sodium: 139 mmol/L (ref 134–144)
Total Protein: 7.1 g/dL (ref 6.0–8.5)
eGFR: 130 mL/min/{1.73_m2} (ref >59)

## 2023-08-17 LAB — CBC
Hematocrit: 40.6 % (ref 34.0–46.6)
Hemoglobin: 13.3 g/dL (ref 11.1–15.9)
MCH: 29.1 pg (ref 26.6–33.0)
MCHC: 32.8 g/dL (ref 31.5–35.7)
MCV: 89 fL (ref 79–97)
Platelets: 331 10*3/uL (ref 150–450)
RBC: 4.57 x10E6/uL (ref 3.77–5.28)
RDW: 12.7 % (ref 11.7–15.4)
WBC: 9.3 10*3/uL (ref 3.4–10.8)

## 2023-08-17 LAB — TSH+FREE T4
Free T4: 1.2 ng/dL (ref 0.82–1.77)
TSH: 0.543 u[IU]/mL (ref 0.450–4.500)

## 2023-08-17 MED ORDER — METFORMIN HCL 500 MG PO TABS: 500.0000 mg | ORAL_TABLET | Freq: Two times a day (BID) | ORAL | 3 refills | Status: DC

## 2023-08-25 ENCOUNTER — Telehealth: Payer: Self-pay

## 2023-08-25 ENCOUNTER — Ambulatory Visit: Admitting: Family Medicine

## 2023-08-25 NOTE — Telephone Encounter (Signed)
 Left VM requesting a return call in order to reschedule appointment. Provider out of office.

## 2023-08-26 ENCOUNTER — Other Ambulatory Visit: Payer: Self-pay | Admitting: Adult Health

## 2023-08-26 MED ORDER — NORETHINDRONE ACETATE 5 MG PO TABS: 5.0000 mg | ORAL_TABLET | Freq: Every day | ORAL | 0 refills | Status: AC

## 2023-08-26 NOTE — Progress Notes (Signed)
Refilled aygestin °

## 2023-08-31 ENCOUNTER — Ambulatory Visit: Admitting: Adult Health

## 2023-09-01 ENCOUNTER — Ambulatory Visit: Admitting: Adult Health

## 2023-12-15 ENCOUNTER — Ambulatory Visit: Admitting: Nurse Practitioner

## 2024-01-17 ENCOUNTER — Other Ambulatory Visit: Payer: Self-pay | Admitting: Adult Health

## 2024-01-31 ENCOUNTER — Other Ambulatory Visit: Payer: Self-pay | Admitting: *Deleted

## 2024-03-06 ENCOUNTER — Telehealth: Payer: Self-pay

## 2024-03-06 NOTE — Telephone Encounter (Signed)
 Patient was identified as falling into the [place] Measure - Diabetes.   Patient was: Appointment already scheduled for:  03/08/24.

## 2024-03-07 NOTE — Patient Instructions (Signed)

## 2024-03-08 ENCOUNTER — Encounter: Payer: Self-pay | Admitting: Nurse Practitioner

## 2024-03-08 ENCOUNTER — Ambulatory Visit: Admitting: Nurse Practitioner

## 2024-03-08 VITALS — BP 134/85 | HR 110 | Resp 18 | Ht 63.0 in | Wt 310.0 lb

## 2024-03-08 DIAGNOSIS — Z7984 Long term (current) use of oral hypoglycemic drugs: Secondary | ICD-10-CM

## 2024-03-08 DIAGNOSIS — E1165 Type 2 diabetes mellitus with hyperglycemia: Secondary | ICD-10-CM

## 2024-03-08 DIAGNOSIS — E559 Vitamin D deficiency, unspecified: Secondary | ICD-10-CM

## 2024-03-08 LAB — POCT GLYCOSYLATED HEMOGLOBIN (HGB A1C): Hemoglobin A1C: 8 % — AB (ref 4.0–5.6)

## 2024-03-08 NOTE — Progress Notes (Signed)
 Endocrinology Consult Note       03/08/2024, 10:31 AM   Subjective:    Patient ID: Savannah Parker, female    DOB: 30-Jan-2001.  Kennidee Heyne is being seen in consultation for management of currently uncontrolled symptomatic diabetes requested by  Pcp, No.   Past Medical History:  Diagnosis Date   Anxiety    Phreesia 04/19/2020   Back pain    Depression    Diabetes mellitus without complication (HCC)    Papilledema    PCOS (polycystic ovarian syndrome)     Past Surgical History:  Procedure Laterality Date   WISDOM TOOTH EXTRACTION Bilateral    2 removed    Social History   Socioeconomic History   Marital status: Married    Spouse name: Not on file   Number of children: 0   Years of education: college   Highest education level: Associate degree: academic program  Occupational History   Occupation: personal care  Tobacco Use   Smoking status: Every Day    Current packs/day: 0.25    Average packs/day: 0.3 packs/day for 3.0 years (0.8 ttl pk-yrs)    Types: Cigarettes   Smokeless tobacco: Never  Vaping Use   Vaping status: Never Used  Substance and Sexual Activity   Alcohol use: Not Currently    Comment: wine occ   Drug use: No   Sexual activity: Yes    Birth control/protection: None  Other Topics Concern   Not on file  Social History Narrative   Lives with significant other.   Right-handed.   No daily use of caffeine.   Social Drivers of Corporate Investment Banker Strain: Low Risk  (04/22/2021)   Overall Financial Resource Strain (CARDIA)    Difficulty of Paying Living Expenses: Not hard at all  Recent Concern: Financial Resource Strain - Medium Risk (04/07/2021)   Overall Financial Resource Strain (CARDIA)    Difficulty of Paying Living Expenses: Somewhat hard  Food Insecurity: No Food Insecurity (04/22/2021)   Hunger Vital Sign    Worried About Running Out of Food in the Last Year:  Never true    Ran Out of Food in the Last Year: Never true  Recent Concern: Food Insecurity - Food Insecurity Present (04/07/2021)   Hunger Vital Sign    Worried About Running Out of Food in the Last Year: Sometimes true    Ran Out of Food in the Last Year: Sometimes true  Transportation Needs: Unmet Transportation Needs (04/22/2021)   PRAPARE - Administrator, Civil Service (Medical): Yes    Lack of Transportation (Non-Medical): No  Physical Activity: Insufficiently Active (04/22/2021)   Exercise Vital Sign    Days of Exercise per Week: 4 days    Minutes of Exercise per Session: 30 min  Stress: Stress Concern Present (04/22/2021)   [employer] of Occupational Health - Occupational Stress Questionnaire    Feeling of Stress : Rather much  Social Connections: Moderately Isolated (04/22/2021)   Social Connection and Isolation Panel    Frequency of Communication with Friends and Family: Once a week    Frequency of Social Gatherings with Friends and Family: Once a week    Attends  Religious Services: 1 to 4 times per year    Active Member of Clubs or Organizations: No    Attends Banker Meetings: Never    Marital Status: Married    Family History  Problem Relation Age of Onset   Cancer Paternal Grandfather        pancreatic   Hypertension Paternal Grandmother    Diabetes Paternal Grandmother    High Cholesterol Paternal Grandmother    Asthma Paternal Grandmother    Bronchitis Paternal Grandmother    Heart attack Maternal Grandfather    Sickle cell anemia Father    Heart failure Mother    Hypertension Mother    Heart disease Mother    Kidney disease Mother    Aortic aneurysm Mother     Outpatient Encounter Medications as of 03/08/2024  Medication Sig   ACCU-CHEK GUIDE test strip ACCU-CHECK GUIDE TEST STRIPS. USE DAILY AS INSTRUCTED   Accu-Chek Softclix Lancets lancets Use as instructed   acetaminophen  (TYLENOL ) 500 MG tablet Take 1,000 mg by mouth  every 6 (six) hours as needed for moderate pain.   albuterol  (VENTOLIN  HFA) 108 (90 Base) MCG/ACT inhaler Inhale into the lungs every 6 (six) hours as needed for wheezing or shortness of breath.   blood glucose meter kit and supplies Dispense based on patient and insurance preference. Use daily to monitor blood sugar. (E11.65)   escitalopram  (LEXAPRO ) 10 MG tablet Take 1 tablet (10 mg total) by mouth daily.   gabapentin (NEURONTIN) 300 MG capsule Take by mouth.   metFORMIN  (GLUCOPHAGE ) 500 MG tablet TAKE 1 TABLET BY MOUTH 2 TIMES DAILY WITH A MEAL   norethindrone  (AYGESTIN ) 5 MG tablet Take 1 tablet (5 mg total) by mouth daily. Take 1 bid x 5 days to stop bleeding, then 1 daily   pantoprazole  (PROTONIX ) 20 MG tablet Take 1 tablet (20 mg total) by mouth daily.   prenatal vitamin w/FE, FA (PRENATAL 1 + 1) 27-1 MG TABS tablet Take 1 tablet by mouth daily at 12 noon.   No facility-administered encounter medications on file as of 03/08/2024.    ALLERGIES: Allergies  Allergen Reactions   Latex Anaphylaxis   Onion Swelling   Other Other (See Comments)    Green pepper - unknown    VACCINATION STATUS: Immunization History  Administered Date(s) Administered   Moderna Covid-19 Fall Seasonal Vaccine 1yrs & older 10/06/2022    Diabetes She presents for her initial diabetic visit. She has type 2 diabetes mellitus. Onset time: diagnosed back in May at age 23. Her disease course has been fluctuating. Hypoglycemia symptoms include nervousness/anxiousness, sweats and tremors. There are no diabetic associated symptoms. There are no hypoglycemic complications. There are no diabetic complications. Risk factors for coronary artery disease include diabetes mellitus, tobacco exposure, family history, obesity and sedentary lifestyle. Current diabetic treatment includes oral agent (monotherapy). She is compliant with treatment some of the time (has been taking both pills at once not BID as prescribed). Her  weight is decreasing steadily. She is following a generally unhealthy diet. When asked about meal planning, she reported none. She has not had a previous visit with a dietitian. She participates in exercise intermittently. (She presents today for her consultation, accompanied by her significant other, with her meter showing no readings since October.  She does not routinely monitor glucose at home.  Her POCT A1c today is 8%, improving from last A1c of 11.1% in May.  She drinks water, cranberry juice, and apple juice.  She quit sodas  at time of her diagnosis.  She eats 1-2 meals per day and snacks between.  She does not engage in routine physical activity.  She is UTD on eye exam, has never seen podiatry in the past.  She notes she is trying to conceive a child and her OBGYN thinks once she has better control of diabetes this will be easier for her.) An ACE inhibitor/angiotensin II receptor blocker is not being taken. She does not see a podiatrist.Eye exam is current.     Review of systems  Constitutional: + slowly decreasing body weight, current Body mass index is 54.91 kg/m., no fatigue, no subjective hyperthermia, no subjective hypothermia Eyes: no blurry vision, no xerophthalmia ENT: no sore throat, no nodules palpated in throat, no dysphagia/odynophagia, no hoarseness Cardiovascular: no chest pain, no shortness of breath, no palpitations, no leg swelling Respiratory: no cough, no shortness of breath Gastrointestinal: no nausea/vomiting/diarrhea Musculoskeletal: no muscle/joint aches Skin: no rashes, no hyperemia Neurological: no tremors, no numbness, no tingling, no dizziness Psychiatric: no depression, no anxiety  Objective:     BP 134/85   Pulse (!) 110   Resp 18   Ht 5' 3 (1.6 m)   Wt (!) 310 lb (140.6 kg)   BMI 54.91 kg/m   Wt Readings from Last 3 Encounters:  03/08/24 (!) 310 lb (140.6 kg)  08/16/23 (!) 311 lb (141.1 kg)  09/21/21 (!) 324 lb 1.2 oz (147 kg)     BP Readings  from Last 3 Encounters:  03/08/24 134/85  08/16/23 132/78  07/20/23 138/88     Physical Exam- Limited  Constitutional:  Body mass index is 54.91 kg/m. , not in acute distress, normal state of mind Eyes:  EOMI, no exophthalmos Neck: Supple Cardiovascular: RRR, no murmurs, rubs, or gallops, no edema Respiratory: Adequate breathing efforts, no crackles, rales, rhonchi, or wheezing Musculoskeletal: no gross deformities, strength intact in all four extremities, no gross restriction of joint movements Skin:  no rashes, no hyperemia Neurological: no tremor with outstretched hands   Diabetic Foot Exam - Simple   No data filed      CMP ( most recent) CMP     Component Value Date/Time   NA 139 08/16/2023 1449   K 3.8 08/16/2023 1449   CL 102 08/16/2023 1449   CO2 22 08/16/2023 1449   GLUCOSE 239 (H) 08/16/2023 1449   GLUCOSE 122 (H) 09/21/2021 2032   BUN 5 (L) 08/16/2023 1449   CREATININE 0.57 08/16/2023 1449   CALCIUM 9.1 08/16/2023 1449   PROT 7.1 08/16/2023 1449   ALBUMIN 3.8 (L) 08/16/2023 1449   AST 56 (H) 08/16/2023 1449   ALT 87 (H) 08/16/2023 1449   ALKPHOS 89 08/16/2023 1449   BILITOT 0.4 08/16/2023 1449   EGFR 130 08/16/2023 1449   GFRNONAA >60 09/21/2021 2032     Diabetic Labs (most recent): Lab Results  Component Value Date   HGBA1C 11.1 (H) 08/16/2023     Lipid Panel ( most recent) Lipid Panel  No results found for: CHOL, TRIG, HDL, CHOLHDL, VLDL, LDLCALC, LDLDIRECT, LABVLDL    Lab Results  Component Value Date   TSH 0.543 08/16/2023   TSH 1.150 04/07/2021   TSH 0.862 07/04/2020   FREET4 1.20 08/16/2023   FREET4 1.48 04/07/2021           Assessment & Plan:   1) Type 2 diabetes mellitus with hyperglycemia, without long-term current use of insulin (HCC) (Primary)  She presents today for her consultation, accompanied by her  significant other, with her meter showing no readings since October.  She does not routinely monitor  glucose at home.  Her POCT A1c today is 8%, improving from last A1c of 11.1% in May.  She drinks water, cranberry juice, and apple juice.  She quit sodas at time of her diagnosis.  She eats 1-2 meals per day and snacks between.  She does not engage in routine physical activity.  She is UTD on eye exam, has never seen podiatry in the past.  She notes she is trying to conceive a child and her OBGYN thinks once she has better control of diabetes this will be easier for her.  - Pernell Dikes has currently uncontrolled symptomatic type 2 DM since 23 years of age, with most recent A1c of 8%.   -Recent labs reviewed.  She does have elevated LFTs, likely related to fatty liver.  Will monitor on subsequent visits.  - I had a long discussion with her about the progressive nature of diabetes and the pathology behind its complications. -her diabetes is not currently complicated but she remains at a high risk for more acute and chronic complications which include CAD, CVA, CKD, retinopathy, and neuropathy. These are all discussed in detail with her.  The following Lifestyle Medicine recommendations according to American College of Lifestyle Medicine [facility]) were discussed and offered to patient and she agrees to start the journey:  A. Whole Foods, Plant-based plate comprising of fruits and vegetables, plant-based proteins, whole-grain carbohydrates was discussed in detail with the patient.   A list for source of those nutrients were also provided to the patient.  Patient will use only water or unsweetened tea for hydration. B.  The need to stay away from risky substances including alcohol, smoking; obtaining 7 to 9 hours of restorative sleep, at least 150 minutes of moderate intensity exercise weekly, the importance of healthy social connections,  and stress reduction techniques were discussed. C.  A full color page of  Calorie density of various food groups per pound showing examples of each food groups was provided  to the patient.  - I have counseled her on diet and weight management by adopting a carbohydrate restricted/protein rich diet. Patient is encouraged to switch to unprocessed or minimally processed complex starch and increased protein intake (animal or plant source), fruits, and vegetables. -  she is advised to stick to a routine mealtimes to eat 3 meals a day and avoid unnecessary snacks (to snack only to correct hypoglycemia).   - she acknowledges that there is a room for improvement in her food and drink choices. - Suggestion is made for her to avoid simple carbohydrates from her diet including Cakes, Sweet Desserts, Ice Cream, Soda (diet and regular), Sweet Tea, Candies, Chips, Cookies, Store Bought Juices, Alcohol in Excess of 1-2 drinks a day, Artificial Sweeteners, Coffee Creamer, and Sugar-free Products. This will help patient to have more stable blood glucose profile and potentially avoid unintended weight gain.  - I have approached her with the following individualized plan to manage her diabetes and patient agrees:    -she is encouraged to start/continue monitoring glucose 2 times daily, before breakfast and before bed, and to call the clinic if she has readings less than 70 or above 300 for 3 tests in a row.  - Adjustment parameters are given to her for hypo and hyperglycemia in writing.  - she is advised to continue Metformin  but to take 500 mg po twice daily after meals (to prevent GI  upset), therapeutically suitable for patient .  - she will be considered for incretin therapy as appropriate next visit.  She does smoke, which increases her risk of pancreatitis with GLP1 medications but she does note she will work on quitting.  We did go over that she cannot take this medication during pregnancy.  She does not have personal or family history of thyroid cancer.  - Specific targets for  A1c; LDL, HDL, and Triglycerides were discussed with the patient.  2) Blood Pressure  /Hypertension:  her blood pressure is controlled to target without the use of antihypertensive medications.   Will consider ACE/ARB if BP elevated on 3 separate occasions.  3) Lipids/Hyperlipidemia:    There is no recent lipid panel available to review, nor is she on any lipid lowering medications.   4)  Weight/Diet:  her Body mass index is 54.91 kg/m.  -  clearly complicating her diabetes care.   she is a candidate for weight loss. I discussed with her the fact that loss of 5 - 10% of her  current body weight will have the most impact on her diabetes management.  Exercise, and detailed carbohydrates information provided  -  detailed on discharge instructions.  5) Chronic Care/Health Maintenance: -she is not on ACEI/ARB or Statin medications and is encouraged to initiate and continue to follow up with Ophthalmology, Dentist, Podiatrist at least yearly or according to recommendations, and advised to QUIT SMOKING. I have recommended yearly flu vaccine and pneumonia vaccine at least every 5 years; moderate intensity exercise for up to 150 minutes weekly; and sleep for at least 7 hours a day.  - she is advised to maintain close follow up with Pcp, No for primary care needs, as well as her other providers for optimal and coordinated care.   - Time spent in this patient care: 60 min, which was spent in counseling her about her diabetes and the rest reviewing her blood glucose logs, discussing her hypoglycemia and hyperglycemia episodes, reviewing her current and previous labs/studies (including abstraction from other facilities) and medications doses and developing a long term treatment plan based on the latest standards of care/guidelines; and documenting her care.    Please refer to Patient Instructions for Blood Glucose Monitoring and Insulin/Medications Dosing Guide in media tab for additional information. Please also refer to Patient Self Inventory in the Media tab for reviewed elements of  pertinent patient history.  Savannah Parker participated in the discussions, expressed understanding, and voiced agreement with the above plans.  All questions were answered to her satisfaction. she is encouraged to contact clinic should she have any questions or concerns prior to her return visit.     Follow up plan: - Return in about 4 months (around 07/07/2024) for Diabetes F/U with A1c in office, No previsit labs.    Benton Rio, [facility] [address] [place], [place] [address] Phone: [phone] Fax: [phone]  03/08/2024, 10:31 AM

## 2024-07-10 ENCOUNTER — Ambulatory Visit: Admitting: Nurse Practitioner
